# Patient Record
Sex: Female | Born: 1955
Health system: Southern US, Community
[De-identification: ages and names within clinical notes are randomized; demographics above are authoritative.]

## PROBLEM LIST (undated history)

## (undated) DIAGNOSIS — E890 Postprocedural hypothyroidism: Secondary | ICD-10-CM

## (undated) DIAGNOSIS — G4730 Sleep apnea, unspecified: Secondary | ICD-10-CM

## (undated) DIAGNOSIS — G4700 Insomnia, unspecified: Secondary | ICD-10-CM

## (undated) DIAGNOSIS — R42 Dizziness and giddiness: Secondary | ICD-10-CM

## (undated) DIAGNOSIS — C73 Malignant neoplasm of thyroid gland: Secondary | ICD-10-CM

## (undated) DIAGNOSIS — E209 Hypoparathyroidism, unspecified: Secondary | ICD-10-CM

## (undated) DIAGNOSIS — Z8719 Personal history of other diseases of the digestive system: Secondary | ICD-10-CM

## (undated) HISTORY — DX: Dizziness and giddiness: R42

## (undated) HISTORY — DX: Sleep apnea, unspecified: G47.30

## (undated) HISTORY — DX: Postprocedural hypothyroidism: E89.0

## (undated) HISTORY — DX: Malignant neoplasm of thyroid gland: C73

## (undated) HISTORY — DX: Personal history of other diseases of the digestive system: Z87.19

## (undated) HISTORY — DX: Insomnia, unspecified: G47.00

## (undated) HISTORY — DX: Hypoparathyroidism, unspecified: E20.9

---

## 2000-04-18 ENCOUNTER — Other Ambulatory Visit: Admission: RE | Admit: 2000-04-18 | Discharge: 2000-04-18 | Payer: Self-pay | Admitting: Obstetrics and Gynecology

## 2001-10-10 ENCOUNTER — Other Ambulatory Visit: Admission: RE | Admit: 2001-10-10 | Discharge: 2001-10-10 | Payer: Self-pay | Admitting: Obstetrics and Gynecology

## 2005-02-24 ENCOUNTER — Encounter (INDEPENDENT_AMBULATORY_CARE_PROVIDER_SITE_OTHER): Payer: Self-pay | Admitting: Family Medicine

## 2007-04-09 ENCOUNTER — Encounter (INDEPENDENT_AMBULATORY_CARE_PROVIDER_SITE_OTHER): Payer: Self-pay | Admitting: Family Medicine

## 2007-05-20 ENCOUNTER — Ambulatory Visit: Payer: Self-pay | Admitting: Family Medicine

## 2007-05-20 DIAGNOSIS — Z8719 Personal history of other diseases of the digestive system: Secondary | ICD-10-CM

## 2007-05-20 HISTORY — DX: Personal history of other diseases of the digestive system: Z87.19

## 2007-05-23 LAB — CONVERTED CEMR LAB
Basophils Absolute: 0 10*3/uL (ref 0.0–0.1)
Basophils Relative: 1 % (ref 0.0–1.0)
Direct LDL: 186.5 mg/dL
Eosinophils Relative: 1.9 % (ref 0.0–5.0)
HCT: 39.9 % (ref 36.0–46.0)
HDL: 57.2 mg/dL (ref >39.0)
Hemoglobin: 13.4 g/dL (ref 12.0–15.0)
MCHC: 33.5 g/dL (ref 30.0–36.0)
Monocytes Relative: 9.1 % (ref 3.0–11.0)
Neutrophils Relative %: 39.3 % — ABNORMAL LOW (ref 43.0–77.0)
RBC: 4.46 M/uL (ref 3.87–5.11)
RDW: 12.9 % (ref 11.5–14.6)
VLDL: 17 mg/dL (ref 0–40)

## 2007-05-24 ENCOUNTER — Telehealth (INDEPENDENT_AMBULATORY_CARE_PROVIDER_SITE_OTHER): Payer: Self-pay | Admitting: *Deleted

## 2007-05-29 ENCOUNTER — Encounter (INDEPENDENT_AMBULATORY_CARE_PROVIDER_SITE_OTHER): Payer: Self-pay | Admitting: *Deleted

## 2007-06-04 ENCOUNTER — Encounter: Admission: RE | Admit: 2007-06-04 | Discharge: 2007-06-04 | Payer: Self-pay | Admitting: Family Medicine

## 2007-06-06 ENCOUNTER — Telehealth (INDEPENDENT_AMBULATORY_CARE_PROVIDER_SITE_OTHER): Payer: Self-pay | Admitting: *Deleted

## 2007-06-17 LAB — CONVERTED CEMR LAB: Pap Smear: NORMAL

## 2007-06-26 ENCOUNTER — Telehealth (INDEPENDENT_AMBULATORY_CARE_PROVIDER_SITE_OTHER): Payer: Self-pay | Admitting: *Deleted

## 2007-06-28 ENCOUNTER — Telehealth (INDEPENDENT_AMBULATORY_CARE_PROVIDER_SITE_OTHER): Payer: Self-pay | Admitting: *Deleted

## 2007-07-03 ENCOUNTER — Encounter (INDEPENDENT_AMBULATORY_CARE_PROVIDER_SITE_OTHER): Payer: Self-pay | Admitting: Family Medicine

## 2007-07-15 ENCOUNTER — Encounter (INDEPENDENT_AMBULATORY_CARE_PROVIDER_SITE_OTHER): Payer: Self-pay | Admitting: Family Medicine

## 2007-07-23 ENCOUNTER — Encounter (INDEPENDENT_AMBULATORY_CARE_PROVIDER_SITE_OTHER): Payer: Self-pay | Admitting: Interventional Radiology

## 2007-07-23 ENCOUNTER — Encounter: Admission: RE | Admit: 2007-07-23 | Discharge: 2007-07-23 | Payer: Self-pay | Admitting: General Surgery

## 2007-07-23 ENCOUNTER — Other Ambulatory Visit: Admission: RE | Admit: 2007-07-23 | Discharge: 2007-07-23 | Payer: Self-pay | Admitting: Interventional Radiology

## 2007-08-02 ENCOUNTER — Encounter (INDEPENDENT_AMBULATORY_CARE_PROVIDER_SITE_OTHER): Payer: Self-pay | Admitting: Family Medicine

## 2007-09-16 ENCOUNTER — Ambulatory Visit (HOSPITAL_COMMUNITY): Admission: RE | Admit: 2007-09-16 | Discharge: 2007-09-17 | Payer: Self-pay | Admitting: General Surgery

## 2007-09-16 ENCOUNTER — Encounter (INDEPENDENT_AMBULATORY_CARE_PROVIDER_SITE_OTHER): Payer: Self-pay | Admitting: General Surgery

## 2007-09-20 ENCOUNTER — Ambulatory Visit: Payer: Self-pay | Admitting: Endocrinology

## 2007-09-20 DIAGNOSIS — C73 Malignant neoplasm of thyroid gland: Secondary | ICD-10-CM

## 2007-09-20 DIAGNOSIS — G4700 Insomnia, unspecified: Secondary | ICD-10-CM

## 2007-09-20 DIAGNOSIS — E890 Postprocedural hypothyroidism: Secondary | ICD-10-CM

## 2007-09-20 HISTORY — DX: Postprocedural hypothyroidism: E89.0

## 2007-09-20 HISTORY — DX: Malignant neoplasm of thyroid gland: C73

## 2007-09-20 HISTORY — DX: Insomnia, unspecified: G47.00

## 2007-09-21 ENCOUNTER — Telehealth (INDEPENDENT_AMBULATORY_CARE_PROVIDER_SITE_OTHER): Payer: Self-pay | Admitting: Family Medicine

## 2007-09-23 ENCOUNTER — Telehealth (INDEPENDENT_AMBULATORY_CARE_PROVIDER_SITE_OTHER): Payer: Self-pay | Admitting: *Deleted

## 2007-09-24 ENCOUNTER — Ambulatory Visit: Payer: Self-pay | Admitting: Endocrinology

## 2007-09-24 DIAGNOSIS — E209 Hypoparathyroidism, unspecified: Secondary | ICD-10-CM

## 2007-09-24 DIAGNOSIS — R252 Cramp and spasm: Secondary | ICD-10-CM | POA: Insufficient documentation

## 2007-09-24 HISTORY — DX: Hypoparathyroidism, unspecified: E20.9

## 2007-09-25 LAB — CONVERTED CEMR LAB
Basophils Absolute: 0 10*3/uL (ref 0.0–0.1)
CO2: 29 meq/L (ref 19–32)
Calcium: 7.6 mg/dL — ABNORMAL LOW (ref 8.4–10.5)
Chloride: 103 meq/L (ref 96–112)
Creatinine, Ser: 0.8 mg/dL (ref 0.4–1.2)
Eosinophils Absolute: 0.1 10*3/uL (ref 0.0–0.6)
Eosinophils Relative: 0.8 % (ref 0.0–5.0)
GFR calc Af Amer: 97 mL/min
GFR calc non Af Amer: 80 mL/min
Hemoglobin: 13.4 g/dL (ref 12.0–15.0)
MCHC: 33.2 g/dL (ref 30.0–36.0)
Neutro Abs: 4 10*3/uL (ref 1.4–7.7)
Neutrophils Relative %: 62.9 % (ref 43.0–77.0)
Potassium: 3.5 meq/L (ref 3.5–5.1)
Sodium: 142 meq/L (ref 135–145)

## 2007-10-01 ENCOUNTER — Ambulatory Visit: Payer: Self-pay | Admitting: Endocrinology

## 2007-10-01 LAB — CONVERTED CEMR LAB
BUN: 13 mg/dL (ref 6–23)
Chloride: 103 meq/L (ref 96–112)
Creatinine, Ser: 0.8 mg/dL (ref 0.4–1.2)
Potassium: 3.7 meq/L (ref 3.5–5.1)

## 2007-10-09 ENCOUNTER — Encounter: Payer: Self-pay | Admitting: Endocrinology

## 2007-10-29 ENCOUNTER — Encounter (INDEPENDENT_AMBULATORY_CARE_PROVIDER_SITE_OTHER): Payer: Self-pay | Admitting: Family Medicine

## 2007-11-01 ENCOUNTER — Ambulatory Visit: Payer: Self-pay | Admitting: Endocrinology

## 2007-11-01 DIAGNOSIS — S20219A Contusion of unspecified front wall of thorax, initial encounter: Secondary | ICD-10-CM | POA: Insufficient documentation

## 2007-11-04 ENCOUNTER — Encounter (INDEPENDENT_AMBULATORY_CARE_PROVIDER_SITE_OTHER): Payer: Self-pay | Admitting: *Deleted

## 2007-11-04 LAB — CONVERTED CEMR LAB
Basophils Absolute: 0 10*3/uL (ref 0.0–0.1)
Eosinophils Absolute: 0.1 10*3/uL (ref 0.0–0.6)
Hemoglobin: 12.5 g/dL (ref 12.0–15.0)
Lymphocytes Relative: 44.5 % (ref 12.0–46.0)
MCV: 90 fL (ref 78.0–100.0)
Monocytes Absolute: 0.5 10*3/uL (ref 0.2–0.7)
Monocytes Relative: 9.1 % (ref 3.0–11.0)
Neutrophils Relative %: 44.6 % (ref 43.0–77.0)

## 2007-11-05 LAB — CONVERTED CEMR LAB
Calcium, Total (PTH): 9.1 mg/dL (ref 8.4–10.5)
PTH: 12.2 pg/mL — ABNORMAL LOW (ref 14.0–72.0)

## 2007-12-30 ENCOUNTER — Telehealth (INDEPENDENT_AMBULATORY_CARE_PROVIDER_SITE_OTHER): Payer: Self-pay | Admitting: *Deleted

## 2007-12-31 ENCOUNTER — Ambulatory Visit: Payer: Self-pay | Admitting: Endocrinology

## 2008-01-01 LAB — CONVERTED CEMR LAB: TSH: 0.79 microintl units/mL (ref 0.35–5.50)

## 2008-02-11 ENCOUNTER — Telehealth (INDEPENDENT_AMBULATORY_CARE_PROVIDER_SITE_OTHER): Payer: Self-pay | Admitting: *Deleted

## 2008-03-30 ENCOUNTER — Ambulatory Visit: Payer: Self-pay | Admitting: Endocrinology

## 2008-03-30 LAB — CONVERTED CEMR LAB
PTH: 18.2 pg/mL (ref 14.0–72.0)
TSH: 1.37 microintl units/mL (ref 0.35–5.50)

## 2008-04-06 ENCOUNTER — Ambulatory Visit: Payer: Self-pay | Admitting: Endocrinology

## 2008-04-06 LAB — CONVERTED CEMR LAB: TSH: 18.45 microintl units/mL — ABNORMAL HIGH (ref 0.35–5.50)

## 2008-04-09 ENCOUNTER — Telehealth: Payer: Self-pay | Admitting: Endocrinology

## 2008-04-10 ENCOUNTER — Ambulatory Visit: Payer: Self-pay | Admitting: Endocrinology

## 2008-04-13 ENCOUNTER — Telehealth: Payer: Self-pay | Admitting: Endocrinology

## 2008-04-15 ENCOUNTER — Encounter: Admission: RE | Admit: 2008-04-15 | Discharge: 2008-04-15 | Payer: Self-pay | Admitting: Endocrinology

## 2008-04-22 ENCOUNTER — Encounter: Admission: RE | Admit: 2008-04-22 | Discharge: 2008-04-22 | Payer: Self-pay | Admitting: Endocrinology

## 2008-06-05 ENCOUNTER — Ambulatory Visit: Payer: Self-pay | Admitting: Endocrinology

## 2008-06-05 LAB — CONVERTED CEMR LAB: Calcium, Total (PTH): 8.4 mg/dL (ref 8.4–10.5)

## 2008-07-17 ENCOUNTER — Ambulatory Visit: Payer: Self-pay | Admitting: Endocrinology

## 2008-07-17 ENCOUNTER — Telehealth (INDEPENDENT_AMBULATORY_CARE_PROVIDER_SITE_OTHER): Payer: Self-pay | Admitting: *Deleted

## 2008-07-17 DIAGNOSIS — R42 Dizziness and giddiness: Secondary | ICD-10-CM

## 2008-07-17 HISTORY — DX: Dizziness and giddiness: R42

## 2008-07-20 ENCOUNTER — Telehealth: Payer: Self-pay | Admitting: Endocrinology

## 2008-07-20 LAB — CONVERTED CEMR LAB
Eosinophils Relative: 2 % (ref 0.0–5.0)
Lymphocytes Relative: 35.7 % (ref 12.0–46.0)
MCHC: 33.5 g/dL (ref 30.0–36.0)
MCV: 89.7 fL (ref 78.0–100.0)
Monocytes Relative: 10.6 % (ref 3.0–12.0)
Neutrophils Relative %: 51.5 % (ref 43.0–77.0)
Platelets: 223 10*3/uL (ref 150–400)
RBC: 4.49 M/uL (ref 3.87–5.11)
TSH: 0.11 microintl units/mL — ABNORMAL LOW (ref 0.35–5.50)

## 2008-09-04 ENCOUNTER — Telehealth (INDEPENDENT_AMBULATORY_CARE_PROVIDER_SITE_OTHER): Payer: Self-pay | Admitting: *Deleted

## 2008-09-08 ENCOUNTER — Ambulatory Visit: Payer: Self-pay | Admitting: Endocrinology

## 2008-09-08 LAB — CONVERTED CEMR LAB
Calcium, Total (PTH): 9.2 mg/dL
PTH: 23.5 pg/mL

## 2008-09-25 LAB — HM COLONOSCOPY: HM Colonoscopy: NORMAL

## 2008-09-30 ENCOUNTER — Telehealth: Payer: Self-pay | Admitting: Endocrinology

## 2008-10-05 ENCOUNTER — Encounter (HOSPITAL_COMMUNITY): Admission: RE | Admit: 2008-10-05 | Discharge: 2009-01-03 | Payer: Self-pay | Admitting: Endocrinology

## 2008-11-20 IMAGING — CR DG CHEST 2V
2 series · 2 of 2 positions shown · non-contrast
Comparison: none

CLINICAL DATA: Preoperative respiratory film on patient with papillary carcinoma of the thyroid. 
 CHEST - 2 VIEW:

[view not recorded (1 of 2)]
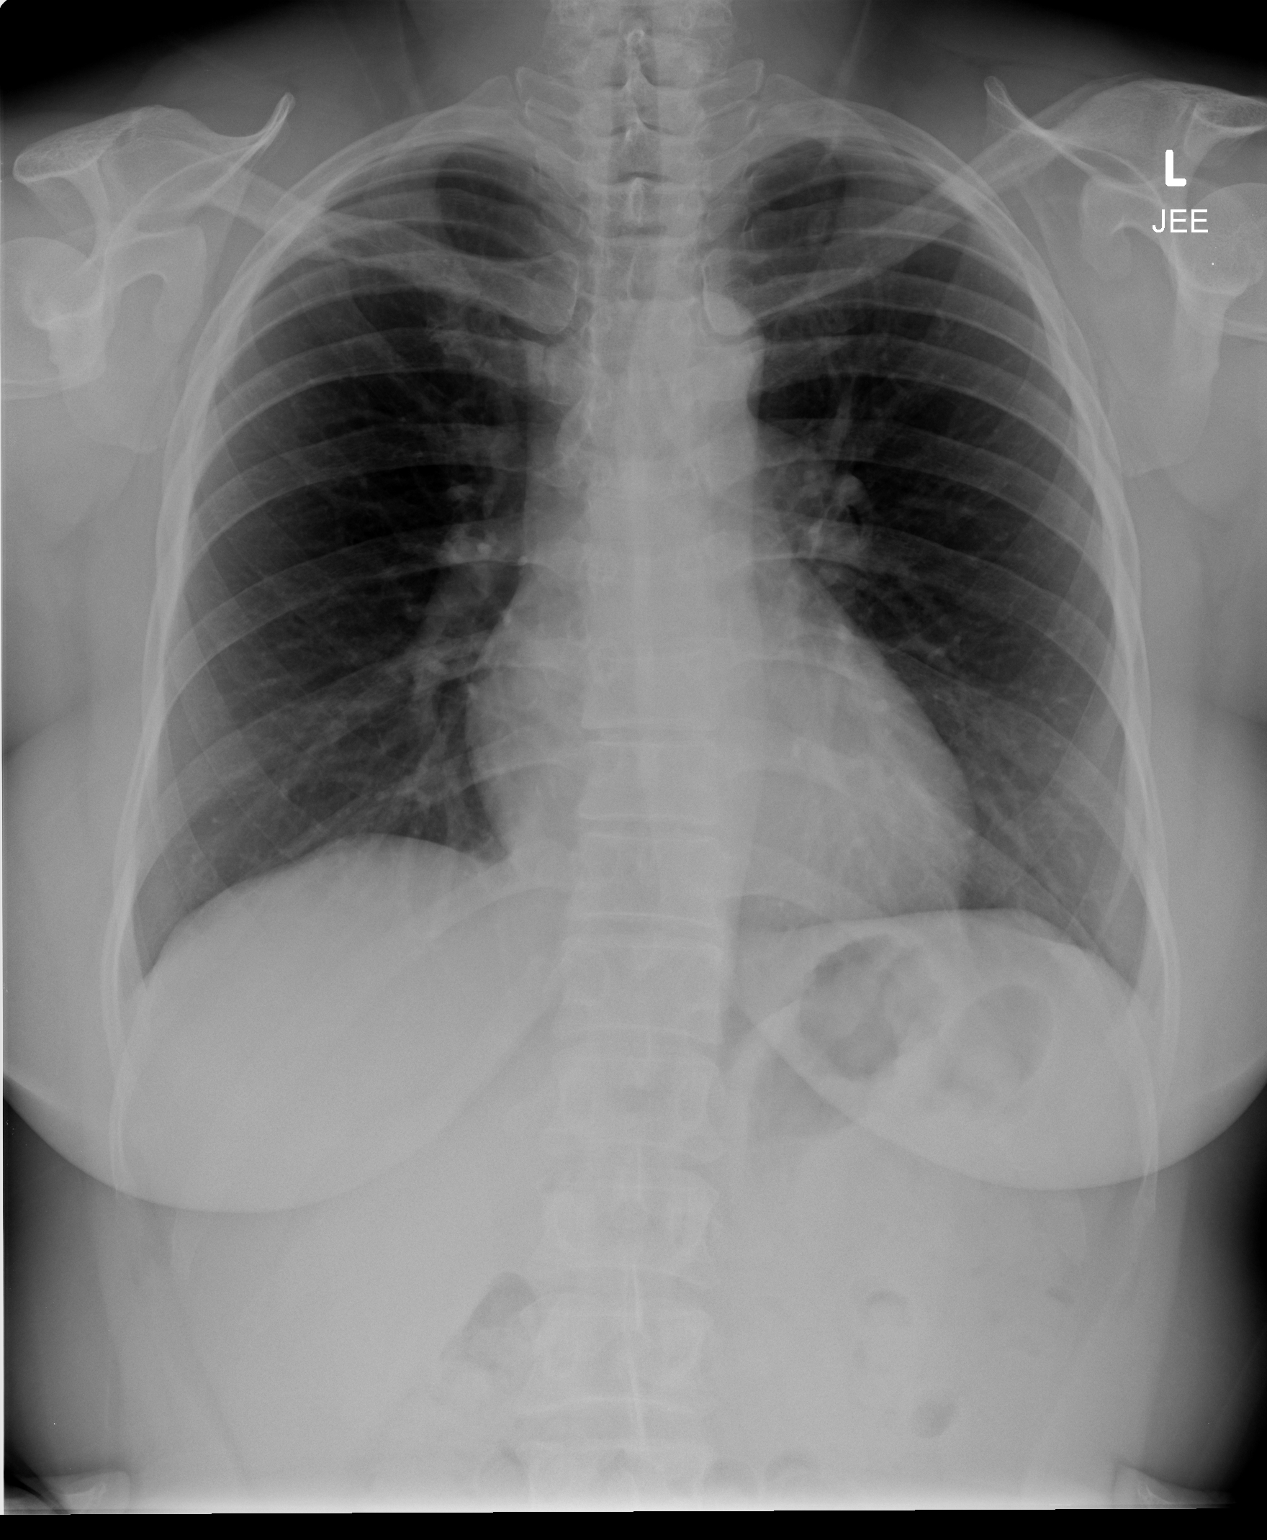

[view not recorded (2 of 2)]
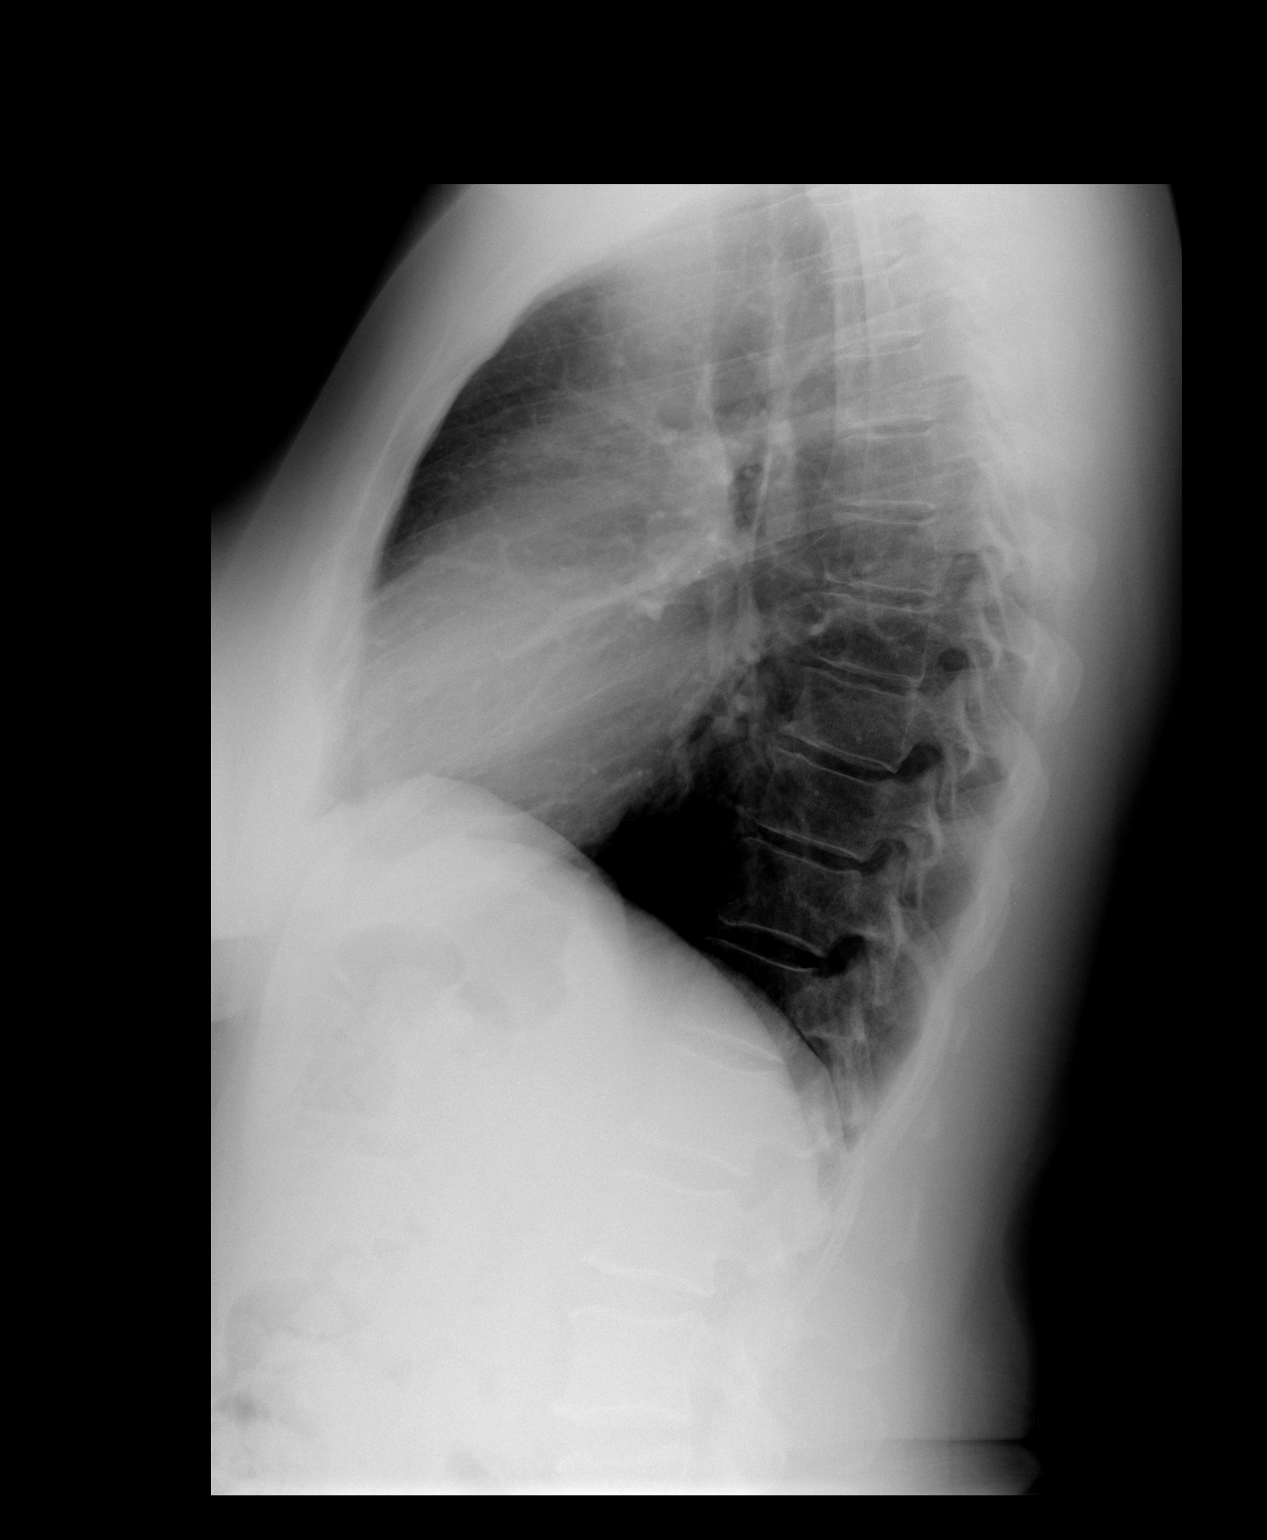

[2 of 2 positions shown; findings below may reference images not displayed]

FINDINGS: Lungs are clear. Heart size is normal.  No effusion.  No focal bony abnormality.
IMPRESSION: Negative chest.

## 2009-03-23 ENCOUNTER — Ambulatory Visit: Payer: Self-pay | Admitting: Endocrinology

## 2009-03-23 LAB — CONVERTED CEMR LAB
PTH: 23.8 pg/mL (ref 14.0–72.0)
TSH: 0.12 microintl units/mL — ABNORMAL LOW (ref 0.35–5.50)

## 2009-03-26 ENCOUNTER — Ambulatory Visit (HOSPITAL_COMMUNITY): Admission: RE | Admit: 2009-03-26 | Discharge: 2009-03-26 | Payer: Self-pay | Admitting: Obstetrics and Gynecology

## 2009-03-26 ENCOUNTER — Encounter (INDEPENDENT_AMBULATORY_CARE_PROVIDER_SITE_OTHER): Payer: Self-pay | Admitting: Obstetrics and Gynecology

## 2009-03-30 ENCOUNTER — Telehealth (INDEPENDENT_AMBULATORY_CARE_PROVIDER_SITE_OTHER): Payer: Self-pay | Admitting: *Deleted

## 2009-03-30 ENCOUNTER — Telehealth: Payer: Self-pay | Admitting: Endocrinology

## 2009-08-16 ENCOUNTER — Telehealth (INDEPENDENT_AMBULATORY_CARE_PROVIDER_SITE_OTHER): Payer: Self-pay | Admitting: *Deleted

## 2009-08-31 ENCOUNTER — Ambulatory Visit: Payer: Self-pay | Admitting: Endocrinology

## 2009-08-31 LAB — CONVERTED CEMR LAB
Calcium, Total (PTH): 8.4 mg/dL (ref 8.4–10.5)
PTH: 21.5 pg/mL (ref 14.0–72.0)
TSH: 0.17 microintl units/mL — ABNORMAL LOW (ref 0.35–5.50)

## 2009-10-21 ENCOUNTER — Encounter: Payer: Self-pay | Admitting: Endocrinology

## 2009-10-27 ENCOUNTER — Telehealth: Payer: Self-pay | Admitting: Endocrinology

## 2010-02-23 ENCOUNTER — Encounter: Payer: Self-pay | Admitting: Endocrinology

## 2010-03-09 ENCOUNTER — Encounter: Admission: RE | Admit: 2010-03-09 | Discharge: 2010-03-09 | Payer: Self-pay | Admitting: Internal Medicine

## 2010-04-11 ENCOUNTER — Encounter: Payer: Self-pay | Admitting: Endocrinology

## 2010-10-16 ENCOUNTER — Encounter: Payer: Self-pay | Admitting: Endocrinology

## 2010-10-18 ENCOUNTER — Ambulatory Visit: Admit: 2010-10-18 | Payer: Self-pay | Admitting: Endocrinology

## 2010-10-25 NOTE — Progress Notes (Signed)
Summary: Low TSH  Phone Note Call from Patient   Caller: Patient [phone] Summary of Call: pt called requesting MD review lab report sent over from PCP with low TSH and make necessary adjustments. Initial call taken by: Margaret Pyle, CMA,  October 27, 2009 10:19 AM  Follow-up for Phone Call        only a slight adjustment is needed.  reduce synthroid to 137 micrograms/day.  i sent rx. Follow-up by: Minus Breeding MD,  October 27, 2009 12:35 PM  Additional Follow-up for Phone Call Additional follow up Details #1::        left message on machine for pt to return my call  Additional Follow-up by: Margaret Pyle, CMA,  October 27, 2009 2:25 PM    Additional Follow-up for Phone Call Additional follow up Details #2::    pt informed Follow-up by: Margaret Pyle, CMA,  October 28, 2009 8:04 AM  New/Updated Medications: SYNTHROID 137 MCG TABS (LEVOTHYROXINE SODIUM) 1 qd Prescriptions: SYNTHROID 137 MCG TABS (LEVOTHYROXINE SODIUM) 1 qd  #30 x 11   Entered and Authorized by:   Minus Breeding MD   Signed by:   Minus Breeding MD on 10/27/2009   Method used:   Electronically to        [facility]* (retail)       [address]       [place], [place]  [address]       Ph: [phone]       Fax: [phone]   RxID:   [id number]

## 2010-10-25 NOTE — Letter (Signed)
Summary: Primary Care Appointment Letter  [facility]  [address] [place], [place] [address]   Phone: [phone]  Fax: [phone]    04/11/2010 MRN: [id number]  [facility] [address] [place], [place]  [address]  Dear Ms. Peggy Wagner,   Your Primary Care Physician Minus Breeding MD has indicated that:    ___X____it is time to schedule an appointment in December with Dr. Everardo All.  Please call the office to set up an appointment.    _______you missed your appointment on______ and need to call and          reschedule.    _______you need to have lab work done.    _______you need to schedule an appointment discuss lab or test results.    _______you need to call to reschedule your appointment that is                       scheduled on _________.     Please call our office as soon as possible. Our phone number is [phone]. Please press option 1. Our office is open 8a-12noon and 1p-5p, Monday through Friday.     Thank you,    [facility]

## 2010-10-28 ENCOUNTER — Other Ambulatory Visit: Payer: BC Managed Care – PPO

## 2010-10-28 ENCOUNTER — Encounter: Payer: Self-pay | Admitting: Endocrinology

## 2010-10-28 ENCOUNTER — Ambulatory Visit (INDEPENDENT_AMBULATORY_CARE_PROVIDER_SITE_OTHER): Payer: BC Managed Care – PPO | Admitting: Endocrinology

## 2010-10-28 ENCOUNTER — Other Ambulatory Visit: Payer: Self-pay | Admitting: Endocrinology

## 2010-10-28 ENCOUNTER — Encounter (INDEPENDENT_AMBULATORY_CARE_PROVIDER_SITE_OTHER): Payer: Self-pay | Admitting: *Deleted

## 2010-10-28 DIAGNOSIS — C73 Malignant neoplasm of thyroid gland: Secondary | ICD-10-CM

## 2010-10-28 DIAGNOSIS — E890 Postprocedural hypothyroidism: Secondary | ICD-10-CM

## 2010-10-28 LAB — CONVERTED CEMR LAB: Thyroglobulin Ab: <20 (ref <40.0)

## 2010-11-10 NOTE — Assessment & Plan Note (Signed)
Summary: RS FROM JAN-MD NOT IN CLINIC--D/T PER PT--STC   Vital Signs:  Patient profile:   55 year old female Height:      64.25 inches (163.19 cm) Weight:      207.25 pounds (94.20 kg) BMI:     35.43 O2 Sat:      97 % on Room air Temp:     98.5 degrees F (36.94 degrees C) oral Pulse rate:   95 / minute BP sitting:   118 / 80  (left arm) Cuff size:   large  Vitals Entered By: Brenton Grills CMA Duncan Dull) (October 28, 2010 3:33 PM)  O2 Flow:  Room air CC: Follow up on thyroid/aj Is Patient Diabetic? No   Primary Provider:  Dr Nehemiah Settle  CC:  Follow up on thyroid/aj.  History of Present Illness: the status of at least 3 ongoing medical problems is addressed today: stage-1 papillary adenocarcinoma of the thyroid: she does not notice any nodule at the neck.   hypoparathyroidism: she has slight tingling in her fingers and feet.   postsurgical hypothyroidism:  denies weight loss.  Current Medications (verified): 1)  Crestor 10 Mg Tabs (Rosuvastatin Calcium) .Marland Kitchen.. 1 Tab Once A Day 2)  Centrum Silver Ultra Womens  Tabs (Multiple Vitamins-Minerals) .Marland Kitchen.. 1 Tab Once A Day 3)  Calcium With Vitamin D 4)  Synthroid 137 Mcg Tabs (Levothyroxine Sodium) .Marland Kitchen.. 1 Qd  Allergies (verified): 1)  ! Pcn 2)  ! Erythromycin 3)  ! Codeine  Past History:  Past Medical History: CARCINOMA, THYROID GLAND, PAPILLARY, stage 1 (ICD-193) 12/08   thyroidectomy--1.8 cm right papillary adeno ca 7/09  i-131 109 mci 12/09  tg neg (ab neg) 1/10  thyrogen scan neg 6/10  tg neg (ab neg) 2/12: tg neg (ab neg) INSOMNIA (ICD-780.52) HYPOTHYROIDISM, POSTSURGICAL (ICD-244.0) RECTAL BLEEDING, HX OF (ICD-V12.79) WELL ADULT EXAM (ICD-V70.0) FAMILY HISTORY OSTEOPOROSIS (ICD-V17.8)  Review of Systems  The patient denies weight gain.         no neck pain  Physical Exam  General:  obese.  no distress  Neck:  a healed scar is present.  i do not appreciate a nodule in the thyroid or elsewhere in the  neck  Additional Exam:  FastTSH              [L]  0.10 uIU/mL      hyroglobulin Antibody    <20.0 U/mL                  <40.0 Thyroglobulin             <0.2 ng/mL                  0.0-55.0   Parathyroid Hormone       18.2 pg/mL                  14.0-72.0   Calcium              [L]  8.1 mg/dL      Impression & Recommendations:  Problem # 1:  CARCINOMA, THYROID GLAND, PAPILLARY (ICD-193) no evidence of recurrence  Problem # 2:  HYPOPARATHYROIDISM (ICD-252.1) Assessment: Deteriorated  Problem # 3:  HYPOTHYROIDISM, POSTSURGICAL (ICD-244.0) suppressed tsh is appropriate in view of #1  Medications Added to Medication List This Visit: 1)  Calcitriol 0.25 Mcg Caps (Calcitriol) .Marland Kitchen.. 1 tab once daily  Other Orders: T-Parathyroid Hormone, Intact w/ Calcium ([address]) T-Thyroglobulin Panel ([address], [address]78-29562) TLB-TSH (Thyroid Stimulating Hormone) (84443-TSH) Est. Patient  Level IV ([address])  Patient Instructions: 1)  blood tests are being ordered for you today.  please call [phone] to hear your test results. 2)  Please schedule a follow-up appointment in 1 year. 3)  (update: i left message on phone-tree:  resume rocaltrol 0.25 micrograms/d.  ret 6 mos). Prescriptions: CALCITRIOL 0.25 MCG CAPS (CALCITRIOL) 1 tab once daily  #30 x 6   Entered and Authorized by:   Minus Breeding MD   Signed by:   Minus Breeding MD on 10/31/2010   Method used:   Electronically to        [facility]* (retail)       [address]       [place], [place]  [id number]       Ph: [phone]       Fax: [phone]   RxID:   [id number] SYNTHROID 137 MCG TABS (LEVOTHYROXINE SODIUM) 1 qd  #90 x 3   Entered and Authorized by:   Minus Breeding MD   Signed by:   Minus Breeding MD on 10/28/2010   Method used:   Electronically to        [facility]* (retail)       [address]       [place], [place]  [id number]       Ph: [phone]       Fax: [phone]   RxID:    [id number]    Orders Added: 1)  T-Parathyroid Hormone, Intact w/ Calcium [02542-70623] 2)  T-Thyroglobulin Panel [76283, [address]] 3)  TLB-TSH (Thyroid Stimulating Hormone) [84443-TSH] 4)  Est. Patient Level IV [[address]]   Not Administered:    Influenza Vaccine # 1 not given due to: declined      Preventive Care Screening  Colonoscopy:    Date:  09/25/2008    Next Due:  09/2018    Results:  normal   Mammogram:    Date:  02/23/2010    Results:  normal   Pap Smear:    Date:  02/23/2010    Results:  normal

## 2011-01-01 LAB — BASIC METABOLIC PANEL
Calcium: 7.9 mg/dL — ABNORMAL LOW (ref 8.4–10.5)
Creatinine, Ser: 0.73 mg/dL (ref 0.4–1.2)
Glucose, Bld: 92 mg/dL (ref 70–99)
Sodium: 138 mEq/L (ref 135–145)

## 2011-01-01 LAB — CBC
HCT: 38.9 % (ref 36.0–46.0)
Hemoglobin: 13.3 g/dL (ref 12.0–15.0)
RDW: 13.1 % (ref 11.5–15.5)

## 2011-01-01 LAB — URINALYSIS, ROUTINE W REFLEX MICROSCOPIC
Bilirubin Urine: NEGATIVE
Glucose, UA: NEGATIVE mg/dL
Hgb urine dipstick: NEGATIVE
Specific Gravity, Urine: <1.005 — ABNORMAL LOW (ref 1.005–1.030)
pH: 7.5 (ref 5.0–8.0)

## 2011-02-07 NOTE — Op Note (Signed)
Peggy Wagner, Peggy Wagner NO.:  [id number]   MEDICAL RECORD NO.:  [id number]          PATIENT TYPE:  OIB   LOCATION:  5151                         FACILITY:  [facility]   PHYSICIAN:  Lennie Muckle, MD      DATE OF BIRTH:  Mar 28, 1956   DATE OF PROCEDURE:  DATE OF DISCHARGE:                               OPERATIVE REPORT   PREOPERATIVE DIAGNOSIS:  Papillary cancer of the right thyroid lobe.   POSTOPERATIVE DIAGNOSIS:  Papillary cancer of the right thyroid lobe.   PROCEDURE:  Total thyroidectomy.   ANESTHESIA:  General endotracheal anesthesia.   SURGEON:  Lennie Muckle, MD   ASSISTANT:  Currie Paris, M.D.   A minimum amount of blood loss.  No immediate complications.  No drains  were placed.   INDICATIONS FOR PROCEDURE:  Peggy Wagner is a 55 year old female who began  to notice neck swelling over the past year.  Ultrasound revealed a  nodule on her right lobe.  FNA was consistent with a papillary  carcinoma.  The size was 2.9 cm.  Due to this size factor, I elected to  perform a total thyroidectomy.  Informed consent was obtained prior to  the procedure.  It was discussed with Peggy Wagner the possibility of blood  loss, however, she is a TEFL teacher Witness, and this was taken into  consideration for the case.   DETAILS OF PROCEDURE:  Peggy Wagner was identified in the preoperative  holding suite.  She was then taken to the operating room where she was  placed in a supine position.  After administration of general  endotracheal anesthesia, she was placed in a chair position with  elevation of the head.  The anterior neck was prepped and draped in the  usual sterile fashion.  A time out was performed indentifying the  patient and procedure.  The sternal notch was palpated as well as the  cricothyroid membrane.  Approximately two fingerbreadths above the  sternal notch, I placed an incision 6 cm in size across the midline of  the neck.  The thyroid was easily palpated  on the right.  Subcutaneous  tissues were divided with electrocautery.  The platysma was easily  separated, superior and inferior flaps elevated, and the platysmal  muscle was successfully performed to the sternal notch as well as  superiorly to the cricothyroid membrane.  The strap muscles were then  divided in the midline with electrocautery and dissection first  proceeded laterally to the right thyroid lobe.  I was able to easily  palpate the right lobe, and it easily with finger dissection elevated  from the surrounding tissues.  I was able to identify the superior lobe  of the superior pole of the thyroid gland, and identified a piece of  tissue which was believed to be a parathyroid gland superiorly.  This  was carefully dissected from the parathyroid sparing the parathyroid  gland and then proceeded dissection distally to the middle of the  thyroid and inferior thyroid vessels.  The recurrent laryngeal nerve was  identified and care was noted to dissect the  thyroid at [facility] ligament  to avoid injury to the recurrent laryngeal nerve.  Using the harmonic  scalpel, I dissected the thyroid from Barry's ligament and proceeded  medially to the isthmus of the thyroid gland.  The inferior parathyroid  was also visualized and spared during the dissection of the right lobe.  I then proceeded with the dissection of the left lobe of the thyroid  gland.  Superiorly, superior pole was identified as was the superior  parathyroid gland.  This was preserved.  Superior pole vessels were  clipped and divided with the harmonic scalpel.  Recurrent laryngeal  nerve was also clearly identified on the left, and using the careful  dissection with the harmonic scalpel, I was able to dissect the thyroid  from Barry's ligament on the left side as well.  Inferior pole vessels  were also divided with the clips and the harmonic scalpel.  The thyroid  gland was then removed.  The right pole was identified with  the suture.  The wound bed was then irrigated.  A minimal amount of oozing was  controlled with electrocautery as well as a silk suture placed and a  small venous tributary inferiorly.  Surgicel was placed bilaterally on  either bed dissection for further control hemostasis and bed dissection  for further control hemostasis.  No bleeding was evident at the end of  the case.  The strap muscles were reapproximated with a running 3-0  Vicryl suture.  Platysma muscles were reapproximated with interrupted  3-  0 Vicryl.  Skin was closed with 4-0 Monocryl.  Dermabond was placed for  a final dressing.  The patient was then extubated and transported to the  postanesthesia care unit in stable condition.      Lennie Muckle, MD  Electronically Signed     ALA/MEDQ  D:  09/16/2007  T:  09/16/2007  Job:  [id number]

## 2011-02-07 NOTE — Op Note (Signed)
Peggy Wagner, Peggy Wagner NO.:  [id number]   MEDICAL RECORD NO.:  [id number]          PATIENT TYPE:  AMB   LOCATION:  SDC                           [facility]:  [facility]   PHYSICIAN:  Janine Limbo, M.D.DATE OF BIRTH:  Feb 03, 1956   DATE OF PROCEDURE:  03/26/2009  DATE OF DISCHARGE:                               OPERATIVE REPORT   PREOPERATIVE DIAGNOSES:  1. Irregular uterine bleeding.  2. Endometrial polyps.   POSTOPERATIVE DIAGNOSES:  1. Irregular uterine bleeding.  2. Endometrial polyps.   PROCEDURE:  1. Hysteroscopy with resection of endometrial polyps.  2. Dilatation and curettage.   SURGEON:  Janine Limbo, MD   FIRST ASSISTANT:  None.   ANESTHETIC:  General.   DISPOSITION:  [name] is a 55 year old female, para 0-0-1-0, who  presents with the above-mentioned diagnoses.  She understands the  indications for her surgical procedure and she accepts the risks of, but  not limited to, anesthetic complications, bleeding, infections, and  possible damage to the surrounding organs.  She understands the other  treatment options.   FINDINGS:  The uterus was normal size, shape, and consistency.  No  adnexal masses were appreciated.  On hysteroscopy, the patient was noted  to have 3 endometrial polyps.  No other pathology was appreciated.  The  estimated fluid deficit was 45 mL.   PROCEDURE:  The patient was taken to the operating room, where a general  anesthetic was given.  The patient's abdomen, perineum, and vagina were  prepped with multiple layers of Betadine.  The bladder was drained of  urine.  The patient was sterilely draped.  Examination under anesthesia  was performed.  A paracervical block was placed using a 10 mL of 0.5%  Marcaine with epinephrine.  An endocervical curettage was obtained.  The  uterus sounded to 8 cm.  The cervix was gently dilated.  The operative  hysteroscope was inserted and the cavity was carefully inspected.  Pictures  were taken.  We used a single loop to resect the endometrial  polyps.  We then removed the hysteroscope and the cavity was curetted  using a medium sharp curette.  The cavity was felt to be clean at the  end of our procedure.  The hysteroscopy was repeated and no other  pathology was appreciated.  The patient was given Toradol 30 mg  intramuscularly and 30 mg intravenously.  Hemostasis was noted to be  adequate.  She was awakened from her anesthetic without difficulty.  Sponge, needle, and instrument counts were correct on 2 occasions.  The  estimated fluid deficit was 45 mL.  The estimated blood loss was 10 mL.  The patient was transported to the recovery room after her anesthetic  was reversed.  She was in stable condition.  The endocervical  curettings, endometrial resections, and endometrial curettings were sent  to pathology for evaluation.   FOLLOWUP INSTRUCTIONS:  The patient will return to see Dr. Stefano Gaul in 2  weeks for followup examination.  She was given a copy of the  postoperative instruction sheet as prepared by the [facility]  [place] for patients, who have undergone a dilatation and curettage.  She was given a prescription for:  1. Zofran 8 mg every 8 hours as needed for nausea.  2. Motrin 800 mg every 8 hours as needed for mild-to-moderate pain.  3. Darvocet 1 or 2 tablets every 4 hours as needed for severe pain.      The patient will call for questions or concerns.      Janine Limbo, M.D.  Electronically Signed     AVS/MEDQ  D:  03/26/2009  T:  03/27/2009  Job:  [id number]

## 2011-02-07 NOTE — H&P (Signed)
NAMEERIKAH, THUMM NO.:  [id number]   MEDICAL RECORD NO.:  [id number]         PATIENT TYPE:  WAMB   LOCATION:                                [facility]  [facility]   PHYSICIAN:  Janine Limbo, M.D.DATE OF BIRTH:  02/11/1956   DATE OF ADMISSION:  03/26/2009  DATE OF DISCHARGE:                              HISTORY & PHYSICAL   HISTORY OF PRESENT ILLNESS:  Ms. Wagner is a 55 year old female, gravida  1, para 0-0-1-0, who presents for a hysteroscopy with resection and  dilatation and curettage.  The patient has been followed at the [facility]  [facility].  The patient had irregular bleeding.  A hydrosonogram was  performed which showed a 6.24 x 4.49 cm uterus with 2 endometrial masses  measuring 1.37 cm and 0.52 cm in size.  An endometrial biopsy was  performed which showed benign elements.  The patient's most recent Pap  smear showed atypical squamous cells of undetermined significance.  Her  HPV test was negative, however.   PAST MEDICAL HISTORY:  The patient has a history of elevated cholesterol  and a history of hypothyroidism.  She was currently taking Crestor and  Synthroid.   OBSTETRICAL HISTORY:  The patient had 1 elective pregnancy termination.   DRUG ALLERGIES:  The patient is allergic to CODEINE, PENICILLIN, and  AZITHROMYCIN.   SOCIAL HISTORY:  The patient drinks alcohol socially.  She denies  cigarette use and other recreational drug uses.   REVIEW OF SYSTEMS:  The patient has occasional insomnia.   FAMILY HISTORY:  Noncontributory.   PHYSICAL EXAMINATION:  VITAL SIGNS:  Weight is 200 pounds.  Height is 5  feet 4 inches.  HEENT:  Within normal limits.  CHEST:  Clear.  HEART:  Regular rate and rhythm.  BREASTS:  Without masses.  ABDOMEN:  Nontender.  EXTREMITIES:  Grossly normal.  NEUROLOGIC:  Grossly normal.  PELVIC:  External genitalia is normal.  Vagina is normal.  Cervix is  nontender.  Uterus is upper limits normal size.  Adnexa, no masses and  rectovaginal exam confirms.   ASSESSMENT:  1. Irregular bleeding.  2. Endometrial polyps.  3. Obesity.  4. Hypothyroidism.  5. Elevated cholesterol.   PLAN:  The patient will undergo a hysteroscopy with resection of  endometrial polyps.  She will also undergo a dilatation and curettage.  She understands the indications for her surgical procedure and she  accepts the risks of, but not limited to, anesthetic complications,  bleeding, infections, and possible damage to the surrounding organs.      Janine Limbo, M.D.  Electronically Signed     AVS/MEDQ  D:  03/22/2009  T:  03/23/2009  Job:  [id number]

## 2011-04-25 ENCOUNTER — Other Ambulatory Visit (INDEPENDENT_AMBULATORY_CARE_PROVIDER_SITE_OTHER): Payer: BC Managed Care – PPO

## 2011-04-25 ENCOUNTER — Ambulatory Visit (INDEPENDENT_AMBULATORY_CARE_PROVIDER_SITE_OTHER): Payer: BC Managed Care – PPO | Admitting: Endocrinology

## 2011-04-25 ENCOUNTER — Encounter: Payer: Self-pay | Admitting: Endocrinology

## 2011-04-25 DIAGNOSIS — C73 Malignant neoplasm of thyroid gland: Secondary | ICD-10-CM

## 2011-04-25 DIAGNOSIS — E209 Hypoparathyroidism, unspecified: Secondary | ICD-10-CM

## 2011-04-25 DIAGNOSIS — E890 Postprocedural hypothyroidism: Secondary | ICD-10-CM

## 2011-04-25 LAB — TSH: TSH: 0.14 u[IU]/mL — ABNORMAL LOW (ref 0.35–5.50)

## 2011-04-25 NOTE — Progress Notes (Signed)
  Subjective:    Patient ID: Peggy Wagner, female    DOB: March 04, 1956, 55 y.o.   MRN: [id number]  HPI Pt says she feels no different, since she is back on the rocaltrol.   She does not notice any nodule at the neck She has 4 years of stage-1 papillary adenocarcinoma of the thyroid, confined to the neck, which is considered mild. She has assoc postsurgical hypoparathyroidism. 12/08   thyroidectomy--1.8 cm right papillary adeno ca. 7/09  i-131 109 mci 12/09  tg neg (ab neg) 1/10  thyrogen scan neg 6/10  tg neg (ab neg) 2/12: tg neg (ab neg) Past Medical History  Diagnosis Date  . CARCINOMA, THYROID GLAND, PAPILLARY 09/20/2007    12/08-thyroidectomy--1.8 cm right papillary adeno ca 07/09-I 131 109 mci  12/09 -tg neg (ab neg) 1/10-thyrogen scan neg 06/10-tg neg (ab neg) 02/12-tg neg (ab neg)  . HYPOTHYROIDISM, POSTSURGICAL 09/20/2007  . Hypoparathyroidism 09/24/2007  . Dizziness and giddiness 07/17/2008  . INSOMNIA 09/20/2007  . RECTAL BLEEDING, HX OF 05/20/2007  . Sleep apnea     No past surgical history on file.  History   Social History  . Marital Status: Single    Spouse Name: N/A    Number of Children: N/A  . Years of Education: N/A   Occupational History  . Teacher Sales executive     [employer]   Social History Main Topics  . Smoking status: Never Smoker   . Smokeless tobacco: Not on file  . Alcohol Use: Yes     occasional  . Drug Use: No  . Sexually Active:    Other Topics Concern  . Not on file   Social History Narrative   Regular exercise-no    Current Outpatient Prescriptions on File Prior to Visit  Medication Sig Dispense Refill  . calcitRIOL (ROCALTROL) 0.25 MCG capsule Take 0.25 mcg by mouth daily.        Marland Kitchen levothyroxine (SYNTHROID, LEVOTHROID) 137 MCG tablet Take 137 mcg by mouth daily.        . Multiple Vitamins-Minerals (CENTRUM SILVER ULTRA WOMENS) TABS Take 1 tablet by mouth daily.        . rosuvastatin (CRESTOR) 10 MG tablet  Take 10 mg by mouth daily.          Allergies  Allergen Reactions  . Codeine   . Erythromycin   . Penicillins     Family History  Problem Relation Age of Onset  . Thyroid disease Sister     uncertain type  . Osteoporosis Other   . Cancer Other     FH of Prostate Cancer-1st degree relative    BP 110/80  Pulse 72  Temp(Src) 98.1 F (36.7 C) (Oral)  Ht 5\' 4"  (1.626 m)  Wt 204 lb 9.6 oz (92.806 kg)  BMI 35.12 kg/m2  SpO2 95%  Review of Systems Denies muscle cramps, and weight change.    Objective:   Physical Exam GENERAL: no distress Neck: a healed scar is present.  i do not appreciate a nodule in the thyroid or elsewhere in the neck   tg neg Lab Results  Component Value Date   PTH 8.6* 04/25/2011   CALCIUM 9.3 04/25/2011   Lab Results  Component Value Date   TSH 0.14* 04/25/2011   Assessment & Plan:  Stage-1 papillary adenocarcinoma of the thyroid, no evidence of recurrence. Postsurgical hypoparathyroidism, well-controlled Postsurgical hypothyroidism.  This tsh is adequate for this situation.

## 2011-04-25 NOTE — Patient Instructions (Addendum)
blood tests are being ordered for you today.  please call [phone] to hear your test results.  You will be prompted to enter the 9-digit "MRN" number that appears at the top left of this page, followed by #.  Then you will hear the message. Please make a follow-up appointment in 1 year. (update: i left message on phone-tree:  rx as we discussed)

## 2011-04-26 LAB — THYROGLOBULIN LEVEL: Thyroglobulin Ab: <20 U/mL (ref <40.0)

## 2011-04-26 LAB — PTH, INTACT AND CALCIUM
Calcium, Total (PTH): 9.3 mg/dL (ref 8.4–10.5)
PTH: 8.6 pg/mL — ABNORMAL LOW (ref 14.0–72.0)

## 2011-06-20 ENCOUNTER — Other Ambulatory Visit: Payer: Self-pay | Admitting: *Deleted

## 2011-06-20 MED ORDER — CALCITRIOL 0.25 MCG PO CAPS: 0.2500 ug | ORAL_CAPSULE | Freq: Every day | ORAL | Status: DC

## 2011-06-20 NOTE — Telephone Encounter (Signed)
R'cd fax from [facility] for refill of Calcitriol  Last OV-03/2011 Last filled-05/21/2011

## 2011-06-30 LAB — CBC
MCHC: 33.4
MCV: 89.2
Platelets: 253

## 2011-06-30 LAB — NO BLOOD PRODUCTS

## 2011-10-10 ENCOUNTER — Other Ambulatory Visit: Payer: Self-pay | Admitting: Endocrinology

## 2011-12-15 ENCOUNTER — Other Ambulatory Visit (INDEPENDENT_AMBULATORY_CARE_PROVIDER_SITE_OTHER): Payer: BC Managed Care – PPO

## 2011-12-15 ENCOUNTER — Ambulatory Visit (INDEPENDENT_AMBULATORY_CARE_PROVIDER_SITE_OTHER): Payer: BC Managed Care – PPO | Admitting: Endocrinology

## 2011-12-15 DIAGNOSIS — E890 Postprocedural hypothyroidism: Secondary | ICD-10-CM

## 2011-12-15 DIAGNOSIS — C73 Malignant neoplasm of thyroid gland: Secondary | ICD-10-CM

## 2011-12-15 DIAGNOSIS — E209 Hypoparathyroidism, unspecified: Secondary | ICD-10-CM

## 2011-12-15 NOTE — Patient Instructions (Addendum)
blood tests are being requested for you today.  You will receive a letter with results. pending the test results, please continue the same medications for now.  

## 2011-12-15 NOTE — Progress Notes (Signed)
  Subjective:    Patient ID: Peggy Wagner, female    DOB: [birth year], 56 y.o.   MRN: [id number]  HPI Pt says she feels no different, since she is back on the rocaltrol.  Denies cramps.  She does not notice any nodule at the neck She has 4 years of stage-1 papillary adenocarcinoma of the thyroid, confined to the neck, which is considered mild. She has assoc postsurgical hypoparathyroidism. 12/08   thyroidectomy--1.8 cm right papillary adeno ca. 7/09  i-131 109 mci 12/09  tg neg (ab neg) 1/10  thyrogen scan neg 6/10  tg neg (ab neg) 2/12: tg neg (ab neg) She has lost a few lbs, due to her efforts.   Past Medical History  Diagnosis Date  . CARCINOMA, THYROID GLAND, PAPILLARY 09/20/2007    12/08-thyroidectomy--1.8 cm right papillary adeno ca 07/09-I 131 109 mci  12/09 -tg neg (ab neg) 1/10-thyrogen scan neg 06/10-tg neg (ab neg) 02/12-tg neg (ab neg)  . HYPOTHYROIDISM, POSTSURGICAL 09/20/2007  . Hypoparathyroidism 09/24/2007  . Dizziness and giddiness 07/17/2008  . INSOMNIA 09/20/2007  . RECTAL BLEEDING, HX OF 05/20/2007  . Sleep apnea     No past surgical history on file.  History   Social History  . Marital Status: Single    Spouse Name: N/A    Number of Children: N/A  . Years of Education: N/A   Occupational History  . Teacher Sales executive     [employer]   Social History Main Topics  . Smoking status: Never Smoker   . Smokeless tobacco: Not on file  . Alcohol Use: Yes     Comment: occasional  . Drug Use: No  . Sexually Active:    Other Topics Concern  . Not on file   Social History Narrative   Regular exercise-no    Current Outpatient Prescriptions on File Prior to Visit  Medication Sig Dispense Refill  . Calcium Carbonate-Vitamin D (CALCIUM + D PO) Take 1 tablet by mouth daily.        . Multiple Vitamins-Minerals (CENTRUM SILVER ULTRA WOMENS) TABS Take 1 tablet by mouth daily.        . rosuvastatin (CRESTOR) 10 MG tablet Take 10 mg by mouth  daily.         No current facility-administered medications on file prior to visit.    Allergies  Allergen Reactions  . Codeine   . Erythromycin   . Penicillins     Family History  Problem Relation Age of Onset  . Thyroid disease Sister     uncertain type  . Osteoporosis Other   . Cancer Other     FH of Prostate Cancer-1st degree relative    There were no vitals taken for this visit.  Review of Systems Denies neck pain and numbness    Objective:   Physical Exam VITAL SIGNS:  See vs page GENERAL: no distress Neck: a healed scar is present.  i do not appreciate a nodule in the thyroid or elsewhere in the neck  (labs are reviewed)    Assessment & Plan:  Thyroid cancer, no evidence of recurrence Postsurgical hypothyroidism, well-replaced

## 2011-12-18 ENCOUNTER — Encounter: Payer: Self-pay | Admitting: Endocrinology

## 2011-12-18 LAB — THYROGLOBULIN ANTIBODY PANEL
Thyroglobulin Ab: <20 U/mL (ref <40.0)
Thyroglobulin: <0.2 ng/mL (ref 0.0–55.0)
Thyroperoxidase Ab SerPl-aCnc: <10 IU/mL (ref <35.0)

## 2011-12-20 ENCOUNTER — Telehealth: Payer: Self-pay | Admitting: *Deleted

## 2011-12-20 NOTE — Telephone Encounter (Signed)
Called pt to inform of result, left message for pt to callback office. (Letter also mailed to pt).

## 2011-12-21 NOTE — Telephone Encounter (Signed)
Pt informed of lab results. 

## 2012-01-22 ENCOUNTER — Other Ambulatory Visit: Payer: Self-pay | Admitting: Endocrinology

## 2012-07-03 ENCOUNTER — Other Ambulatory Visit: Payer: Self-pay | Admitting: General Practice

## 2012-07-03 MED ORDER — CALCITRIOL 0.25 MCG PO CAPS: 0.2500 ug | ORAL_CAPSULE | Freq: Every day | ORAL | Status: DC

## 2012-07-17 ENCOUNTER — Other Ambulatory Visit: Payer: Self-pay | Admitting: General Practice

## 2012-07-17 MED ORDER — LEVOTHYROXINE SODIUM 137 MCG PO TABS: ORAL_TABLET | ORAL | Status: DC

## 2012-08-15 ENCOUNTER — Other Ambulatory Visit: Payer: Self-pay | Admitting: *Deleted

## 2012-08-15 MED ORDER — LEVOTHYROXINE SODIUM 137 MCG PO TABS: ORAL_TABLET | ORAL | Status: DC

## 2012-08-15 NOTE — Telephone Encounter (Signed)
REFILL ON MEDICATION SYNTHROID SENT TO [facility]. PATIENT NOTIFIED OF THIS AND NEW PHONE # AND ADDRESS TO MAKE FOLLOW UP APPT. WITH DR. Everardo All FOR FURTHER REFILLS.

## 2012-09-05 ENCOUNTER — Ambulatory Visit: Payer: BC Managed Care – PPO | Admitting: Physical Therapy

## 2012-09-09 ENCOUNTER — Ambulatory Visit (INDEPENDENT_AMBULATORY_CARE_PROVIDER_SITE_OTHER): Payer: BC Managed Care – PPO | Admitting: Endocrinology

## 2012-09-09 ENCOUNTER — Encounter: Payer: Self-pay | Admitting: Endocrinology

## 2012-09-09 VITALS — BP 124/70 | HR 88 | Temp 97.8°F | Wt 207.0 lb

## 2012-09-09 DIAGNOSIS — E209 Hypoparathyroidism, unspecified: Secondary | ICD-10-CM

## 2012-09-09 DIAGNOSIS — E890 Postprocedural hypothyroidism: Secondary | ICD-10-CM

## 2012-09-09 DIAGNOSIS — C73 Malignant neoplasm of thyroid gland: Secondary | ICD-10-CM

## 2012-09-09 LAB — MAGNESIUM: Magnesium: 1.8 mg/dL (ref 1.5–2.5)

## 2012-09-09 NOTE — Patient Instructions (Addendum)
blood tests are being requested for you today.  We'll contact you with results.   Please return in 1 year.   

## 2012-09-09 NOTE — Progress Notes (Signed)
Subjective:    Patient ID: Peggy Wagner, female    DOB: 1956-03-23, 56 y.o.   MRN: [id number]  HPI The state of at least three ongoing medical problems is addressed today, with interval history of each noted here: Postsurgical hypothyroidism: Pt says she feels no different, since she is back on the rocaltrol.  Denies cramps.  She returns for f/u of stage-1 papillary adenocarcinoma of the thyroid.  She does not notice any nodule at the neck 12/08   thyroidectomy--1.8 cm right papillary adeno ca.   7/09  i-131 109 mci 12/09  tg neg (ab neg) 1/10  thyrogen scan neg 6/10  tg neg (ab neg) 2/12: tg neg (ab neg). 12/13: tg undetectable (ab neg) Postsurgical hypothyroidism: she denies weight change  Past Medical History  Diagnosis Date  . CARCINOMA, THYROID GLAND, PAPILLARY 09/20/2007    12/08-thyroidectomy--1.8 cm right papillary adeno ca 07/09-I 131 109 mci  12/09 -tg neg (ab neg) 1/10-thyrogen scan neg 06/10-tg neg (ab neg) 02/12-tg neg (ab neg)  . HYPOTHYROIDISM, POSTSURGICAL 09/20/2007  . Hypoparathyroidism 09/24/2007  . Dizziness and giddiness 07/17/2008  . INSOMNIA 09/20/2007  . RECTAL BLEEDING, HX OF 05/20/2007  . Sleep apnea     No past surgical history on file.  History   Social History  . Marital Status: Single    Spouse Name: N/A    Number of Children: N/A  . Years of Education: N/A   Occupational History  . Teacher Sales executive     [employer]   Social History Main Topics  . Smoking status: Never Smoker   . Smokeless tobacco: Not on file  . Alcohol Use: Yes     Comment: occasional  . Drug Use: No  . Sexually Active:    Other Topics Concern  . Not on file   Social History Narrative   Regular exercise-no    Current Outpatient Prescriptions on File Prior to Visit  Medication Sig Dispense Refill  . calcitRIOL (ROCALTROL) 0.25 MCG capsule Take 1 capsule (0.25 mcg total) by mouth daily.  30 capsule  9  . Calcium Carbonate-Vitamin D (CALCIUM  + D PO) Take 1 tablet by mouth daily.        Marland Kitchen levofloxacin (LEVAQUIN) 500 MG tablet Take 500 mg by mouth daily.       . Multiple Vitamins-Minerals (CENTRUM SILVER ULTRA WOMENS) TABS Take 1 tablet by mouth daily.        . rosuvastatin (CRESTOR) 10 MG tablet Take 10 mg by mouth daily.          Allergies  Allergen Reactions  . Codeine   . Erythromycin   . Penicillins     Family History  Problem Relation Age of Onset  . Thyroid disease Sister     uncertain type  . Osteoporosis Other   . Cancer Other     FH of Prostate Cancer-1st degree relative    BP 124/70  Pulse 88  Temp 97.8 F (36.6 C) (Oral)  Wt 207 lb (93.895 kg)  SpO2 97%  Review of Systems Denies neck pain and numbness    Objective:   Physical Exam VITAL SIGNS:  See vs page GENERAL: no distress Neck: a healed scar is present.  i do not appreciate a nodule in the thyroid or elsewhere in the neck.    Lab Results  Component Value Date   TSH 0.017* 09/09/2012  TG is undetectable (ab neg) Lab Results  Component Value Date   PTH 14.4 09/09/2012  CALCIUM 8.2* 09/09/2012      Assessment & Plan:  Postsurgical hypothyroidism, slightly overreplaced. Stage-1 thyroid cancer.  No evidence of recurrence. Postsurgical hypoparathyroidism, well-controlled.

## 2012-09-10 LAB — PTH, INTACT AND CALCIUM
Calcium, Total (PTH): 8.2 mg/dL — ABNORMAL LOW (ref 8.4–10.5)
PTH: 14.4 pg/mL (ref 14.0–72.0)

## 2012-09-11 MED ORDER — LEVOTHYROXINE SODIUM 125 MCG PO TABS: 125.0000 ug | ORAL_TABLET | Freq: Every day | ORAL | Status: DC

## 2012-09-15 MED ORDER — LEVOTHYROXINE SODIUM 125 MCG PO TABS: 125.0000 ug | ORAL_TABLET | Freq: Every day | ORAL | Status: DC

## 2012-09-24 ENCOUNTER — Ambulatory Visit: Payer: BC Managed Care – PPO | Attending: Family Medicine | Admitting: Physical Therapy

## 2012-09-24 DIAGNOSIS — M2569 Stiffness of other specified joint, not elsewhere classified: Secondary | ICD-10-CM | POA: Insufficient documentation

## 2012-09-24 DIAGNOSIS — IMO0001 Reserved for inherently not codable concepts without codable children: Secondary | ICD-10-CM | POA: Insufficient documentation

## 2012-09-24 DIAGNOSIS — M542 Cervicalgia: Secondary | ICD-10-CM | POA: Insufficient documentation

## 2012-10-01 ENCOUNTER — Encounter: Payer: Self-pay | Admitting: Endocrinology

## 2012-10-02 ENCOUNTER — Ambulatory Visit: Payer: No Typology Code available for payment source | Attending: Family Medicine | Admitting: Physical Therapy

## 2012-10-02 DIAGNOSIS — M2569 Stiffness of other specified joint, not elsewhere classified: Secondary | ICD-10-CM | POA: Insufficient documentation

## 2012-10-02 DIAGNOSIS — M542 Cervicalgia: Secondary | ICD-10-CM | POA: Insufficient documentation

## 2012-10-02 DIAGNOSIS — IMO0001 Reserved for inherently not codable concepts without codable children: Secondary | ICD-10-CM | POA: Insufficient documentation

## 2012-10-09 ENCOUNTER — Ambulatory Visit: Payer: No Typology Code available for payment source | Attending: Family Medicine | Admitting: Physical Therapy

## 2012-10-09 DIAGNOSIS — M542 Cervicalgia: Secondary | ICD-10-CM | POA: Insufficient documentation

## 2012-10-09 DIAGNOSIS — M2569 Stiffness of other specified joint, not elsewhere classified: Secondary | ICD-10-CM | POA: Insufficient documentation

## 2012-10-09 DIAGNOSIS — IMO0001 Reserved for inherently not codable concepts without codable children: Secondary | ICD-10-CM | POA: Insufficient documentation

## 2012-11-09 ENCOUNTER — Other Ambulatory Visit: Payer: Self-pay

## 2013-05-14 ENCOUNTER — Encounter: Payer: Self-pay | Admitting: Endocrinology

## 2013-05-14 ENCOUNTER — Ambulatory Visit (INDEPENDENT_AMBULATORY_CARE_PROVIDER_SITE_OTHER): Payer: BC Managed Care – PPO | Admitting: Endocrinology

## 2013-05-14 VITALS — BP 134/80 | HR 57 | Ht 64.0 in | Wt 206.0 lb

## 2013-05-14 DIAGNOSIS — E890 Postprocedural hypothyroidism: Secondary | ICD-10-CM

## 2013-05-14 DIAGNOSIS — C73 Malignant neoplasm of thyroid gland: Secondary | ICD-10-CM

## 2013-05-14 DIAGNOSIS — E209 Hypoparathyroidism, unspecified: Secondary | ICD-10-CM

## 2013-05-14 LAB — TSH: TSH: 0.16 u[IU]/mL — ABNORMAL LOW (ref 0.35–5.50)

## 2013-05-14 MED ORDER — LEVOTHYROXINE SODIUM 112 MCG PO TABS: 112.0000 ug | ORAL_TABLET | Freq: Every day | ORAL | Status: DC

## 2013-05-14 NOTE — Progress Notes (Signed)
Subjective:    Patient ID: Peggy Wagner, female    DOB: 1956/09/20, 57 y.o.   MRN: [id number]  HPI The state of at least three ongoing medical problems is addressed today, with interval history of each noted here: Postsurgical hypoparathyroidism:  Denies cramps.  She takes rocaltrol intermittently.   She returns for f/u of stage-1 papillary adenocarcinoma of the thyroid.  She does not notice any nodule at the neck 12/08   thyroidectomy--1.8 cm right papillary adeno ca (T1 N0 M0) 7/09  i-131 109 mci 12/09  tg neg (ab neg) 1/10  thyrogen scan neg 6/10  tg neg (ab neg) 2/12: tg neg (ab neg). 12/13: tg undetectable (ab neg) 8/14: tg undetectable (ab neg) Postsurgical hypothyroidism: she has lost a few lbs.   Past Medical History  Diagnosis Date  . CARCINOMA, THYROID GLAND, PAPILLARY 09/20/2007    12/08-thyroidectomy--1.8 cm right papillary adeno ca 07/09-I 131 109 mci  12/09 -tg neg (ab neg) 1/10-thyrogen scan neg 06/10-tg neg (ab neg) 02/12-tg neg (ab neg)  . HYPOTHYROIDISM, POSTSURGICAL 09/20/2007  . Hypoparathyroidism 09/24/2007  . Dizziness and giddiness 07/17/2008  . INSOMNIA 09/20/2007  . RECTAL BLEEDING, HX OF 05/20/2007  . Sleep apnea     No past surgical history on file.  History   Social History  . Marital Status: Single    Spouse Name: N/A    Number of Children: N/A  . Years of Education: N/A   Occupational History  . Teacher Sales executive     [employer]   Social History Main Topics  . Smoking status: Never Smoker   . Smokeless tobacco: Not on file  . Alcohol Use: Yes     Comment: occasional  . Drug Use: No  . Sexual Activity:    Other Topics Concern  . Not on file   Social History Narrative   Regular exercise-no    Current Outpatient Prescriptions on File Prior to Visit  Medication Sig Dispense Refill  . calcitRIOL (ROCALTROL) 0.25 MCG capsule Take 1 capsule (0.25 mcg total) by mouth daily.  30 capsule  9  . Calcium  Carbonate-Vitamin D (CALCIUM + D PO) Take 1 tablet by mouth daily.        Marland Kitchen etodolac (LODINE) 500 MG tablet Take 500 mg by mouth 2 (two) times daily.      Marland Kitchen levofloxacin (LEVAQUIN) 500 MG tablet Take 500 mg by mouth daily.       . Multiple Vitamins-Minerals (CENTRUM SILVER ULTRA WOMENS) TABS Take 1 tablet by mouth daily.        . rosuvastatin (CRESTOR) 10 MG tablet Take 10 mg by mouth daily.         No current facility-administered medications on file prior to visit.    Allergies  Allergen Reactions  . Codeine   . Erythromycin   . Penicillins     Family History  Problem Relation Age of Onset  . Thyroid disease Sister     uncertain type  . Osteoporosis Other   . Cancer Other     FH of Prostate Cancer-1st degree relative    BP 134/80  Pulse 57  Ht 5\' 4"  (1.626 m)  Wt 206 lb (93.441 kg)  BMI 35.34 kg/m2  SpO2 97%  Review of Systems She has nausea and unsteadiness on her feet.    Objective:   Physical Exam VITAL SIGNS:  See vs page GENERAL: no distress.  Neck: a healed scar is present.  i do not appreciate a  nodule in the thyroid or elsewhere in the neck  Lab Results  Component Value Date   PTH 14.5 05/14/2013   CALCIUM 9.7 05/14/2013   Lab Results  Component Value Date   TSH 0.16* 05/14/2013      Assessment & Plan:  Thyroid cancer.  no evidence of recurrence Postsurgical hypothyroidism, overreplaced Postsurgical hypoparathyroidism, overcontrolled.

## 2013-05-14 NOTE — Patient Instructions (Addendum)
blood tests are being requested for you today.  We'll contact you with results.   Please return in 1 year.   

## 2013-05-15 LAB — PTH, INTACT AND CALCIUM
Calcium: 9.7 mg/dL (ref 8.4–10.5)
PTH: 14.5 pg/mL (ref 14.0–72.0)

## 2013-05-17 ENCOUNTER — Telehealth: Payer: Self-pay | Admitting: Endocrinology

## 2013-05-17 NOTE — Telephone Encounter (Signed)
Please call pt Please reduce the calcitriol to 1 tab 3 times a week (m, f, w)

## 2013-05-19 NOTE — Telephone Encounter (Signed)
Left message, pt to call if any questions 

## 2013-07-10 ENCOUNTER — Encounter: Payer: Self-pay | Admitting: Endocrinology

## 2013-07-31 ENCOUNTER — Other Ambulatory Visit: Payer: Self-pay

## 2013-09-07 ENCOUNTER — Other Ambulatory Visit: Payer: Self-pay | Admitting: Endocrinology

## 2013-09-09 ENCOUNTER — Telehealth: Payer: Self-pay | Admitting: *Deleted

## 2013-09-09 NOTE — Telephone Encounter (Signed)
Patient called and did not leave reason for her call. I returned call and left a message for [place] to please call us back. [id number]//[id number]

## 2014-04-13 ENCOUNTER — Encounter: Payer: Self-pay | Admitting: Endocrinology

## 2014-04-13 ENCOUNTER — Ambulatory Visit (INDEPENDENT_AMBULATORY_CARE_PROVIDER_SITE_OTHER): Payer: BC Managed Care – PPO | Admitting: Endocrinology

## 2014-04-13 VITALS — BP 118/78 | HR 87 | Temp 98.6°F | Ht 64.0 in | Wt 211.0 lb

## 2014-04-13 DIAGNOSIS — E209 Hypoparathyroidism, unspecified: Secondary | ICD-10-CM

## 2014-04-13 DIAGNOSIS — C73 Malignant neoplasm of thyroid gland: Secondary | ICD-10-CM

## 2014-04-13 DIAGNOSIS — E890 Postprocedural hypothyroidism: Secondary | ICD-10-CM

## 2014-04-13 LAB — TSH: TSH: 0.05 u[IU]/mL — ABNORMAL LOW (ref 0.35–4.50)

## 2014-04-13 MED ORDER — LEVOTHYROXINE SODIUM 100 MCG PO TABS: 100.0000 ug | ORAL_TABLET | Freq: Every day | ORAL | Status: DC

## 2014-04-13 NOTE — Progress Notes (Signed)
Subjective:    Patient ID: Peggy Wagner, female    DOB: 03/15/1956, 58 y.o.   MRN: [id number]  HPI The state of at least three ongoing medical problems is addressed today, with interval history of each noted here: Postsurgical hypoparathyroidism:  Denies cramps.  She takes rocaltrol QOD. She returns for f/u of stage-1 papillary adenocarcinoma of the thyroid.  She does not notice any nodule at the neck.   12/08   thyroidectomy--1.8 cm right papillary adeno ca (T1 N0 M0).  7/09  i-131 109 mci 12/09  tg neg (ab neg) 1/10  thyrogen scan neg 6/10  tg neg (ab neg) 2/12: tg neg (ab neg). 12/13: tg undetectable (ab neg) 8/14: tg undetectable (ab neg) Postsurgical hypothyroidism: she has slight separation of the fingernails from the nailbed, and assoc fatigue. Past Medical History  Diagnosis Date  . CARCINOMA, THYROID GLAND, PAPILLARY 09/20/2007    12/08-thyroidectomy--1.8 cm right papillary adeno ca 07/09-I 131 109 mci  12/09 -tg neg (ab neg) 1/10-thyrogen scan neg 06/10-tg neg (ab neg) 02/12-tg neg (ab neg)  . HYPOTHYROIDISM, POSTSURGICAL 09/20/2007  . Hypoparathyroidism 09/24/2007  . Dizziness and giddiness 07/17/2008  . INSOMNIA 09/20/2007  . RECTAL BLEEDING, HX OF 05/20/2007  . Sleep apnea     No past surgical history on file.  History   Social History  . Marital Status: Single    Spouse Name: N/A    Number of Children: N/A  . Years of Education: N/A   Occupational History  . 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 Smoking status: Never Smoker   . Smokeless tobacco: Not on file  . Alcohol Use: Yes     Comment: occasional  . Drug Use: No  . Sexual Activity:    Other Topics Concern  . Not on file   Social History Narrative   Regular exercise-no    Current Outpatient Prescriptions on File Prior to Visit  Medication Sig Dispense Refill  . Calcium Carbonate-Vitamin D (CALCIUM + D PO) Take 1 tablet by mouth daily.         Marland Kitchen etodolac (LODINE) 500 MG tablet Take 500 mg by mouth 2 (two) times daily.      . Multiple Vitamins-Minerals (CENTRUM SILVER ULTRA WOMENS) TABS Take 1 tablet by mouth daily.        . rosuvastatin (CRESTOR) 10 MG tablet Take 10 mg by mouth daily.         No current facility-administered medications on file prior to visit.    Allergies  Allergen Reactions  . Codeine   . Erythromycin   . Penicillins     Family History  Problem Relation Age of Onset  . Thyroid disease Sister     uncertain type  . Osteoporosis Other   . Cancer Other     FH of Prostate Cancer-1st degree relative    BP 118/78  Pulse 87  Temp(Src) 98.6 F (37 C) (Oral)  Ht 5\' 4"  (1.626 m)  Wt 211 lb (95.709 kg)  BMI 36.20 kg/m2  SpO2 95%   Review of Systems She has decreased appetite.  Denies weight change.  She has intermittent headache.    Objective:   Physical Exam VITAL SIGNS:  See vs page GENERAL: no distress Neck: a healed scar is present.  i do not appreciate a nodule in the thyroid or elsewhere in the neck Fingernails: normal  Lab Results  Component Value Date  TSH 0.05* 04/13/2014       Assessment & Plan:  Hypothyroidism: overreplaced Thyroid cancer: low-risk for recurrence Hypocalcemia: due for recheck. Onycholysis: new.  Could be thyroid-related.   Patient is advised the following: Patient Instructions  blood tests are being requested for you today.  We'll contact you with results. Please return in 1 year.

## 2014-04-13 NOTE — Patient Instructions (Signed)
blood tests are being requested for you today.  We'll contact you with results.   Please return in 1 year.   

## 2014-04-14 LAB — THYROGLOBULIN LEVEL: Thyroglobulin: <0.2 ng/mL (ref 0.0–55.0)

## 2014-04-14 LAB — THYROGLOBULIN ANTIBODY

## 2014-04-14 LAB — PTH, INTACT AND CALCIUM
Calcium: 8.2 mg/dL — ABNORMAL LOW (ref 8.4–10.5)
PTH: 19.6 pg/mL (ref 14.0–72.0)

## 2014-04-16 ENCOUNTER — Other Ambulatory Visit: Payer: Self-pay

## 2014-04-17 ENCOUNTER — Telehealth: Payer: Self-pay | Admitting: Endocrinology

## 2014-04-17 NOTE — Telephone Encounter (Signed)
Pt would like her lab results please.

## 2014-04-17 NOTE — Telephone Encounter (Signed)
Pt advised of lab results.  

## 2014-06-16 ENCOUNTER — Other Ambulatory Visit: Payer: Self-pay | Admitting: Endocrinology

## 2015-01-13 ENCOUNTER — Encounter: Payer: Self-pay | Admitting: Endocrinology

## 2015-01-13 ENCOUNTER — Ambulatory Visit (INDEPENDENT_AMBULATORY_CARE_PROVIDER_SITE_OTHER): Payer: BC Managed Care – PPO | Admitting: Endocrinology

## 2015-01-13 VITALS — BP 142/90 | HR 92 | Temp 98.1°F | Ht 64.0 in | Wt 219.0 lb

## 2015-01-13 DIAGNOSIS — E209 Hypoparathyroidism, unspecified: Secondary | ICD-10-CM

## 2015-01-13 DIAGNOSIS — E890 Postprocedural hypothyroidism: Secondary | ICD-10-CM

## 2015-01-13 DIAGNOSIS — C73 Malignant neoplasm of thyroid gland: Secondary | ICD-10-CM

## 2015-01-13 NOTE — Patient Instructions (Addendum)
blood tests are being requested for you today.  We'll contact you with results.   Please return in 1 year.   

## 2015-01-13 NOTE — Progress Notes (Signed)
Subjective:    Patient ID: Peggy Wagner, female    DOB: Jan 26, 1956, 59 y.o.   MRN: [id number]  HPI The state of at least three ongoing medical problems is addressed today, with interval history of each noted here: She returns for f/u of stage-1 papillary adenocarcinoma of the thyroid.  She does not notice any nodule at the neck.   12/08   thyroidectomy--1.8 cm right papillary adeno ca (T1 N0 M0).  7/09  i-131 109 mci 12/09  tg neg (ab neg) 1/10  thyrogen scan neg 6/10  tg neg (ab neg) 2/12: tg neg (ab neg). 12/13: tg undetectable (ab neg) 8/14: tg undetectable (ab neg) Postsurgical hypothyroidism: she has constipation. Denies cramps Past Medical History  Diagnosis Date  . CARCINOMA, THYROID GLAND, PAPILLARY 09/20/2007    12/08-thyroidectomy--1.8 cm right papillary adeno ca 07/09-I 131 109 mci  12/09 -tg neg (ab neg) 1/10-thyrogen scan neg 06/10-tg neg (ab neg) 02/12-tg neg (ab neg)  . HYPOTHYROIDISM, POSTSURGICAL 09/20/2007  . Hypoparathyroidism 09/24/2007  . Dizziness and giddiness 07/17/2008  . INSOMNIA 09/20/2007  . RECTAL BLEEDING, HX OF 05/20/2007  . Sleep apnea     No past surgical history on file.  History   Social History  . Marital Status: Single    Spouse Name: N/A  . Number of Children: N/A  . Years of Education: N/A   Occupational History  . 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 Smoking status: Never Smoker   . Smokeless tobacco: Not on file  . Alcohol Use: Yes     Comment: occasional  . Drug Use: No  . Sexual Activity: Not on file   Other Topics Concern  . Not on file   Social History Narrative   Regular exercise-no    Current Outpatient Prescriptions on File Prior to Visit  Medication Sig Dispense Refill  . calcitRIOL (ROCALTROL) 0.25 MCG capsule TAKE (1) CAPSULE DAILY. 30 capsule 2  . Calcium Carbonate-Vitamin D (CALCIUM + D PO) Take 1 tablet by mouth daily.      Marland Kitchen etodolac (LODINE) 500 MG  tablet Take 500 mg by mouth 2 (two) times daily.    Marland Kitchen levothyroxine (SYNTHROID, LEVOTHROID) 100 MCG tablet Take 1 tablet (100 mcg total) by mouth daily before breakfast. 30 tablet 11  . Magnesium 100 MG CAPS Take by mouth.    . Multiple Vitamins-Minerals (CENTRUM SILVER ULTRA WOMENS) TABS Take 1 tablet by mouth daily.      . rosuvastatin (CRESTOR) 10 MG tablet Take 10 mg by mouth daily.       No current facility-administered medications on file prior to visit.    Allergies  Allergen Reactions  . Codeine   . Erythromycin   . Penicillins     Family History  Problem Relation Age of Onset  . Thyroid disease Sister     uncertain type  . Osteoporosis Other   . Cancer Other     FH of Prostate Cancer-1st degree relative    BP 142/90 mmHg  Pulse 92  Temp(Src) 98.1 F (36.7 C) (Oral)  Ht 5\' 4"  (1.626 m)  Wt 219 lb (99.338 kg)  BMI 37.57 kg/m2  SpO2 97%    Review of Systems She has mild depression.  She has hair loss.     Objective:   Physical Exam VITAL SIGNS:  See vs page GENERAL: no distress Neck: a healed scar is present.  i do not appreciate  a nodule in the thyroid or elsewhere in the neck.    Lab Results  Component Value Date   TSH 0.83 01/13/2015   Vit-D=normal    Assessment & Plan:  postsurgical hypothyroidism: well-replaced.  In view of long cancer-free interval, we can dose synthroid to a normal TSH. Postsurgical hypoparathyroidism: on rx, and due for recheck today. Thyroid cancer: no clinical evidence of recurrence.     Patient is advised the following: Patient Instructions  blood tests are being requested for you today.  We'll contact you with results. Please return in 1 year.    addendum: Please continue the same synthroid.

## 2015-01-14 LAB — MAGNESIUM: Magnesium: 2 mg/dL (ref 1.5–2.5)

## 2015-01-14 LAB — TSH: TSH: 0.83 u[IU]/mL (ref 0.35–4.50)

## 2015-01-14 LAB — VITAMIN D 25 HYDROXY (VIT D DEFICIENCY, FRACTURES): VITD: 34.17 ng/mL (ref 30.00–100.00)

## 2015-01-14 LAB — PHOSPHORUS: Phosphorus: 3.4 mg/dL (ref 2.3–4.6)

## 2015-01-15 LAB — PTH, INTACT AND CALCIUM
Calcium: 8.6 mg/dL (ref 8.4–10.5)
PTH: 20 pg/mL (ref 14–64)

## 2015-01-15 LAB — THYROGLOBULIN ANTIBODY: Thyroglobulin Ab: <1 IU/mL (ref <2)

## 2015-01-15 LAB — THYROGLOBULIN LEVEL: Thyroglobulin: <0.1 ng/mL — ABNORMAL LOW (ref 2.8–40.9)

## 2015-01-19 ENCOUNTER — Other Ambulatory Visit: Payer: Self-pay

## 2015-01-19 MED ORDER — LEVOTHYROXINE SODIUM 100 MCG PO TABS: 100.0000 ug | ORAL_TABLET | Freq: Every day | ORAL | Status: DC

## 2015-01-20 ENCOUNTER — Ambulatory Visit: Payer: Self-pay | Admitting: Endocrinology

## 2015-01-21 ENCOUNTER — Other Ambulatory Visit: Payer: Self-pay | Admitting: Endocrinology

## 2015-05-30 ENCOUNTER — Other Ambulatory Visit: Payer: Self-pay | Admitting: Endocrinology

## 2015-08-17 ENCOUNTER — Other Ambulatory Visit: Payer: Self-pay | Admitting: Endocrinology

## 2015-11-10 ENCOUNTER — Other Ambulatory Visit: Payer: Self-pay | Admitting: Endocrinology

## 2015-12-10 ENCOUNTER — Other Ambulatory Visit: Payer: Self-pay | Admitting: Endocrinology

## 2016-01-10 ENCOUNTER — Other Ambulatory Visit: Payer: Self-pay | Admitting: Endocrinology

## 2016-02-11 ENCOUNTER — Other Ambulatory Visit: Payer: Self-pay | Admitting: Endocrinology

## 2016-02-15 ENCOUNTER — Encounter: Payer: Self-pay | Admitting: Endocrinology

## 2016-02-15 ENCOUNTER — Ambulatory Visit (INDEPENDENT_AMBULATORY_CARE_PROVIDER_SITE_OTHER): Payer: BC Managed Care – PPO | Admitting: Endocrinology

## 2016-02-15 VITALS — BP 136/88 | HR 83 | Temp 98.2°F | Ht 64.0 in | Wt 220.0 lb

## 2016-02-15 DIAGNOSIS — C73 Malignant neoplasm of thyroid gland: Secondary | ICD-10-CM

## 2016-02-15 DIAGNOSIS — E890 Postprocedural hypothyroidism: Secondary | ICD-10-CM | POA: Diagnosis not present

## 2016-02-15 DIAGNOSIS — E209 Hypoparathyroidism, unspecified: Secondary | ICD-10-CM | POA: Diagnosis not present

## 2016-02-15 LAB — TSH: TSH: 5.25 u[IU]/mL — ABNORMAL HIGH (ref 0.35–4.50)

## 2016-02-15 MED ORDER — LEVOTHYROXINE SODIUM 112 MCG PO TABS: 112.0000 ug | ORAL_TABLET | Freq: Every day | ORAL | Status: DC

## 2016-02-15 NOTE — Patient Instructions (Addendum)
blood tests are requested for you today.  We'll let you know about the results.   Please return in 1 year.  

## 2016-02-15 NOTE — Progress Notes (Signed)
Subjective:    Patient ID: Peggy Wagner, female    DOB: 1955-11-10, 60 y.o.   MRN: [id number]  HPI The state of at least three ongoing medical problems is addressed today, with interval history of each noted here: She returns for f/u of stage-1 papillary adenocarcinoma of the thyroid.  She does not notice any nodule at the neck.   12/08   thyroidectomy--1.8 cm right papillary adeno ca (T1 N0 M0).  7/09  i-131 109 mci 12/09  tg neg (ab neg) 1/10  thyrogen scan neg 6/10  tg neg (ab neg) 2/12: tg neg (ab neg). 12/13: tg undetectable (ab neg) 8/14: tg undetectable (ab neg) 4/16: tg undetectable (ab neg) Postsurgical hypothyroidism: she has gained weight.  Hypoparathyroidism: she denies cramps.   Past Medical History  Diagnosis Date  . CARCINOMA, THYROID GLAND, PAPILLARY 09/20/2007    12/08-thyroidectomy--1.8 cm right papillary adeno ca 07/09-I 131 109 mci  12/09 -tg neg (ab neg) 1/10-thyrogen scan neg 06/10-tg neg (ab neg) 02/12-tg neg (ab neg)  . HYPOTHYROIDISM, POSTSURGICAL 09/20/2007  . Hypoparathyroidism ([place]) 09/24/2007  . Dizziness and giddiness 07/17/2008  . INSOMNIA 09/20/2007  . RECTAL BLEEDING, HX OF 05/20/2007  . Sleep apnea     No past surgical history on file.  Social History   Social History  . Marital Status: Single    Spouse Name: N/A  . Number of Children: N/A  . Years of Education: N/A   Occupational History  . 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 Smoking status: Never Smoker   . Smokeless tobacco: Not on file  . Alcohol Use: Yes     Comment: occasional  . Drug Use: No  . Sexual Activity: Not on file   Other Topics Concern  . Not on file   Social History Narrative   Regular exercise-no    Current Outpatient Prescriptions on File Prior to Visit  Medication Sig Dispense Refill  . calcitRIOL (ROCALTROL) 0.25 MCG capsule TAKE (1) CAPSULE DAILY. 30 capsule 0  . Calcium Carbonate-Vitamin D  (CALCIUM + D PO) Take 1 tablet by mouth daily.      . Multiple Vitamins-Minerals (CENTRUM SILVER ULTRA WOMENS) TABS Take 1 tablet by mouth daily.      . rosuvastatin (CRESTOR) 10 MG tablet Take 10 mg by mouth daily.      Marland Kitchen etodolac (LODINE) 500 MG tablet Take 500 mg by mouth 2 (two) times daily. Reported on 02/15/2016    . Magnesium 100 MG CAPS Take by mouth. Reported on 02/15/2016     No current facility-administered medications on file prior to visit.    Allergies  Allergen Reactions  . Codeine   . Erythromycin   . Penicillins     Family History  Problem Relation Age of Onset  . Thyroid disease Sister     uncertain type  . Osteoporosis Other   . Cancer Other     FH of Prostate Cancer-1st degree relative    BP 136/88 mmHg  Pulse 83  Temp(Src) 98.2 F (36.8 C) (Oral)  Ht 5\' 4"  (1.626 m)  Wt 220 lb (99.791 kg)  BMI 37.74 kg/m2  SpO2 93%  Review of Systems Denies numbness and edema    Objective:   Physical Exam VITAL SIGNS:  See vs page GENERAL: no distress Neck: a healed scar is present.  i do not appreciate a nodule in the thyroid or elsewhere in the neck.  Lab Results  Component Value Date   PTH 23 02/15/2016   CALCIUM 8.0* 02/15/2016   PHOS 3.4 01/13/2015   Lab Results  Component Value Date   TSH 5.25* 02/15/2016   TG=undetectable    Assessment & Plan:  Hypoparathyroidism: well-controlled, as this is in the goal Ca++ of 8-9.  Hypothyroidism: she needs increased rx.  Differentiated thyroid cancer, no evidence of recurrence.    Patient is advised the following: Patient Instructions  blood tests are requested for you today.  We'll let you know about the results.  Please return in 1 year.   addendum: i have sent a prescription to your pharmacy, to increase synthroid

## 2016-02-16 LAB — THYROGLOBULIN LEVEL: Thyroglobulin: 0.1 ng/mL — ABNORMAL LOW (ref 2.8–40.9)

## 2016-02-16 LAB — PTH, INTACT AND CALCIUM
CALCIUM: 8 mg/dL — AB (ref 8.4–10.5)
PTH: 23 pg/mL (ref 14–64)

## 2016-02-16 LAB — THYROGLOBULIN ANTIBODY: Thyroglobulin Ab: <1 IU/mL (ref <2)

## 2016-02-18 ENCOUNTER — Other Ambulatory Visit: Payer: Self-pay | Admitting: Endocrinology

## 2016-08-03 ENCOUNTER — Other Ambulatory Visit (INDEPENDENT_AMBULATORY_CARE_PROVIDER_SITE_OTHER): Payer: BC Managed Care – PPO

## 2016-08-03 ENCOUNTER — Telehealth: Payer: Self-pay | Admitting: Endocrinology

## 2016-08-03 DIAGNOSIS — E209 Hypoparathyroidism, unspecified: Secondary | ICD-10-CM

## 2016-08-03 DIAGNOSIS — E890 Postprocedural hypothyroidism: Secondary | ICD-10-CM

## 2016-08-03 LAB — VITAMIN D 25 HYDROXY (VIT D DEFICIENCY, FRACTURES): VITD: 27.89 ng/mL — ABNORMAL LOW (ref 30.00–100.00)

## 2016-08-03 LAB — TSH: TSH: 0.35 u[IU]/mL (ref 0.35–4.50)

## 2016-08-03 NOTE — Telephone Encounter (Signed)
See message and please advise, Thanks!  

## 2016-08-03 NOTE — Telephone Encounter (Signed)
I ordered.

## 2016-08-03 NOTE — Telephone Encounter (Signed)
I contacted the patient and advised of message. Requested a call back from the patient to schedule her lab appointment.

## 2016-08-03 NOTE — Telephone Encounter (Signed)
Pt called in requesting to have some of her levels checked, she said she wanted to make sure she is still on the correct dose of Synthroid and also her calcium.  Requests call back.

## 2016-08-04 LAB — PTH, INTACT AND CALCIUM
CALCIUM: 9 mg/dL (ref 8.6–10.4)
PTH: 15 pg/mL (ref 14–64)

## 2016-08-08 ENCOUNTER — Encounter: Payer: Self-pay | Admitting: Endocrinology

## 2016-08-10 ENCOUNTER — Other Ambulatory Visit: Payer: Self-pay

## 2016-08-10 MED ORDER — CALCITRIOL 0.25 MCG PO CAPS: ORAL_CAPSULE | ORAL | 3 refills | Status: AC

## 2017-02-05 ENCOUNTER — Other Ambulatory Visit: Payer: Self-pay | Admitting: Endocrinology

## 2017-02-05 NOTE — Telephone Encounter (Signed)
Please refill x 1 Ov is due  

## 2017-03-10 ENCOUNTER — Other Ambulatory Visit: Payer: Self-pay | Admitting: Endocrinology

## 2017-03-10 NOTE — Telephone Encounter (Signed)
Please refill x 3 mos Ov is due 

## 2017-03-12 ENCOUNTER — Telehealth: Payer: Self-pay | Admitting: Internal Medicine

## 2017-03-12 NOTE — Telephone Encounter (Signed)
**  Remind patient they can make refill requests via MyChart**  Medication refill request (Name & Dosage):    SYNTHROID 112 MCG tablet  Preferred pharmacy (Name & Address):  [facility], [place]  Other comments (if applicable):

## 2017-03-13 MED ORDER — LEVOTHYROXINE SODIUM 112 MCG PO TABS: 112.0000 ug | ORAL_TABLET | Freq: Every day | ORAL | 0 refills | Status: DC

## 2017-03-13 NOTE — Telephone Encounter (Signed)
Refill submitted. 

## 2017-03-22 ENCOUNTER — Ambulatory Visit (INDEPENDENT_AMBULATORY_CARE_PROVIDER_SITE_OTHER): Payer: BC Managed Care – PPO | Admitting: Endocrinology

## 2017-03-22 ENCOUNTER — Encounter: Payer: Self-pay | Admitting: Endocrinology

## 2017-03-22 VITALS — BP 130/86 | HR 83 | Ht 64.0 in | Wt 211.0 lb

## 2017-03-22 DIAGNOSIS — E890 Postprocedural hypothyroidism: Secondary | ICD-10-CM

## 2017-03-22 DIAGNOSIS — E209 Hypoparathyroidism, unspecified: Secondary | ICD-10-CM | POA: Diagnosis not present

## 2017-03-22 LAB — TSH: TSH: 0.07 u[IU]/mL — ABNORMAL LOW (ref 0.35–4.50)

## 2017-03-22 MED ORDER — LEVOTHYROXINE SODIUM 100 MCG PO TABS: 100.0000 ug | ORAL_TABLET | Freq: Every day | ORAL | 3 refills | Status: DC

## 2017-03-22 NOTE — Progress Notes (Signed)
Subjective:    Patient ID: Peggy Wagner, female    DOB: December 22, 1955, 61 y.o.   MRN: [id number]  HPI The state of at least three ongoing medical problems is addressed today, with interval history of each noted here: She returns for f/u of stage-1 papillary adenocarcinoma of the thyroid.  She does not notice any nodule at the neck.  Stable problem 12/08   thyroidectomy--1.8 cm right papillary adeno ca (T1 N0 M0).  7/09  i-131 109 [facility] 12/09  tg neg (ab neg) 1/10  thyrogen scan neg 6/10  tg neg (ab neg) 2/12: tg neg (ab neg). 12/13: tg undetectable (ab neg) 8/14: tg undetectable (ab neg) 4/16: tg undetectable (ab neg) Postsurgical hypothyroidism: she has dry skin.  Stable problem Hypoparathyroidism: she denies cramps.  Stable problem Past Medical History:  Diagnosis Date  . CARCINOMA, THYROID GLAND, PAPILLARY 09/20/2007   12/08-thyroidectomy--1.8 cm right papillary adeno ca 07/09-I 131 109 [facility]  12/09 -tg neg (ab neg) 1/10-thyrogen scan neg 06/10-tg neg (ab neg) 02/12-tg neg (ab neg)  . Dizziness and giddiness 07/17/2008  . Hypoparathyroidism ([place]) 09/24/2007  . HYPOTHYROIDISM, POSTSURGICAL 09/20/2007  . INSOMNIA 09/20/2007  . RECTAL BLEEDING, HX OF 05/20/2007  . Sleep apnea     No past surgical history on file.  Social History   Social History  . Marital status: Single    Spouse name: N/A  . Number of children: N/A  . Years of education: N/A   Occupational History  . 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 Smoking status: Never Smoker  . Smokeless tobacco: Never Used  . Alcohol use Yes     Comment: occasional  . Drug use: No  . Sexual activity: Not on file   Other Topics Concern  . Not on file   Social History Narrative   Regular exercise-no    Current Outpatient Prescriptions on File Prior to Visit  Medication Sig Dispense Refill  . b complex vitamins capsule Take 1 capsule by mouth daily.    . calcitRIOL  (ROCALTROL) 0.25 MCG capsule TAKE (1) CAPSULE DAILY. 30 capsule 3  . Calcium Carbonate-Vitamin D (CALCIUM + D PO) Take 1 tablet by mouth daily.      . Multiple Vitamins-Minerals (CENTRUM SILVER ULTRA WOMENS) TABS Take 1 tablet by mouth daily.      . rosuvastatin (CRESTOR) 10 MG tablet Take 10 mg by mouth daily.       No current facility-administered medications on file prior to visit.     Allergies  Allergen Reactions  . Codeine   . Erythromycin   . Penicillins     Family History  Problem Relation Age of Onset  . Thyroid disease Sister        uncertain type  . Osteoporosis Other   . Cancer Other        FH of Prostate Cancer-1st degree relative    BP 130/86   Pulse 83   Ht 5\' 4"  (1.626 m)   Wt 211 lb (95.7 kg)   SpO2 94%   BMI 36.22 kg/m    Review of Systems She has constipation, but no numbness    Objective:   Physical Exam VITAL SIGNS:  See vs page GENERAL: no distress Neck: a healed scar is present.  i do not appreciate a nodule in the thyroid or elsewhere in the neck     Assessment & Plan:  Hypoparathyroidism: due for recheck  Hypothyroidism: due for recheck Differentiated thyroid cancer, no evidence of recurrence.  She wants to skip TG level  Patient Instructions  blood tests are requested for you today.  We'll let you know about the results.  Please let me know if you want to take the once-a-day injection to replaced the low parathyroid level.  Here is some information about it.   Please return in 1 year.

## 2017-03-22 NOTE — Patient Instructions (Addendum)
blood tests are requested for you today.  We'll let you know about the results.  Please let me know if you want to take the once-a-day injection to replaced the low parathyroid level.  Here is some information about it.   Please return in 1 year.

## 2017-03-23 LAB — PTH, INTACT AND CALCIUM
CALCIUM: 7.9 mg/dL — AB (ref 8.6–10.4)
PTH: 20 pg/mL (ref 14–64)

## 2017-03-23 MED ORDER — LEVOTHYROXINE SODIUM 88 MCG PO TABS: 88.0000 ug | ORAL_TABLET | Freq: Every day | ORAL | 3 refills | Status: DC

## 2017-04-25 ENCOUNTER — Encounter: Payer: Self-pay | Admitting: Endocrinology

## 2017-04-26 ENCOUNTER — Other Ambulatory Visit: Payer: Self-pay

## 2017-04-26 MED ORDER — LEVOTHYROXINE SODIUM 88 MCG PO TABS: 88.0000 ug | ORAL_TABLET | Freq: Every day | ORAL | 11 refills | Status: DC

## 2017-09-10 ENCOUNTER — Other Ambulatory Visit: Payer: Self-pay | Admitting: Internal Medicine

## 2017-09-10 DIAGNOSIS — Z85850 Personal history of malignant neoplasm of thyroid: Secondary | ICD-10-CM

## 2017-09-10 DIAGNOSIS — E890 Postprocedural hypothyroidism: Secondary | ICD-10-CM

## 2017-09-14 ENCOUNTER — Ambulatory Visit
Admission: RE | Admit: 2017-09-14 | Discharge: 2017-09-14 | Disposition: A | Discharge status: Home / Self Care | Payer: BC Managed Care – PPO | Source: Ambulatory Visit | Attending: Internal Medicine | Admitting: Internal Medicine

## 2017-09-14 ENCOUNTER — Other Ambulatory Visit: Payer: Self-pay

## 2017-09-14 DIAGNOSIS — Z85850 Personal history of malignant neoplasm of thyroid: Secondary | ICD-10-CM

## 2017-09-14 DIAGNOSIS — E890 Postprocedural hypothyroidism: Secondary | ICD-10-CM

## 2017-10-11 ENCOUNTER — Other Ambulatory Visit: Payer: Self-pay | Admitting: Internal Medicine

## 2017-10-11 DIAGNOSIS — Z85850 Personal history of malignant neoplasm of thyroid: Secondary | ICD-10-CM

## 2018-03-15 ENCOUNTER — Ambulatory Visit
Admission: RE | Admit: 2018-03-15 | Discharge: 2018-03-15 | Disposition: A | Discharge status: Home / Self Care | Payer: BC Managed Care – PPO | Source: Ambulatory Visit | Attending: Internal Medicine | Admitting: Internal Medicine

## 2018-03-15 DIAGNOSIS — Z85850 Personal history of malignant neoplasm of thyroid: Secondary | ICD-10-CM

## 2020-01-13 ENCOUNTER — Other Ambulatory Visit: Payer: Self-pay | Admitting: Obstetrics and Gynecology

## 2020-01-13 DIAGNOSIS — N950 Postmenopausal bleeding: Secondary | ICD-10-CM

## 2020-01-16 ENCOUNTER — Ambulatory Visit
Admission: RE | Admit: 2020-01-16 | Discharge: 2020-01-16 | Disposition: A | Discharge status: Home / Self Care | Payer: BC Managed Care – PPO | Source: Ambulatory Visit | Attending: Obstetrics and Gynecology | Admitting: Obstetrics and Gynecology

## 2020-01-16 DIAGNOSIS — N950 Postmenopausal bleeding: Secondary | ICD-10-CM

## 2020-02-16 ENCOUNTER — Other Ambulatory Visit: Payer: Self-pay | Admitting: Internal Medicine

## 2020-02-16 ENCOUNTER — Ambulatory Visit
Admission: RE | Admit: 2020-02-16 | Discharge: 2020-02-16 | Disposition: A | Discharge status: Home / Self Care | Payer: BC Managed Care – PPO | Source: Ambulatory Visit | Attending: Internal Medicine | Admitting: Internal Medicine

## 2020-02-16 DIAGNOSIS — M545 Low back pain, unspecified: Secondary | ICD-10-CM

## 2020-06-30 ENCOUNTER — Encounter (HOSPITAL_BASED_OUTPATIENT_CLINIC_OR_DEPARTMENT_OTHER): Payer: Self-pay

## 2020-06-30 ENCOUNTER — Ambulatory Visit (HOSPITAL_BASED_OUTPATIENT_CLINIC_OR_DEPARTMENT_OTHER): Admit: 2020-06-30 | Payer: BC Managed Care – PPO | Admitting: Obstetrics and Gynecology

## 2020-06-30 SURGERY — DILATATION & CURETTAGE/HYSTEROSCOPY WITH MYOSURE
Anesthesia: Choice

## 2020-09-08 ENCOUNTER — Other Ambulatory Visit: Payer: Self-pay

## 2020-09-08 ENCOUNTER — Encounter (HOSPITAL_BASED_OUTPATIENT_CLINIC_OR_DEPARTMENT_OTHER): Payer: Self-pay | Admitting: Obstetrics and Gynecology

## 2020-09-11 ENCOUNTER — Inpatient Hospital Stay (HOSPITAL_COMMUNITY): Admission: RE | Admit: 2020-09-11 | Payer: Medicare PPO | Source: Ambulatory Visit

## 2020-09-13 ENCOUNTER — Other Ambulatory Visit (HOSPITAL_COMMUNITY)
Admission: RE | Admit: 2020-09-13 | Discharge: 2020-09-13 | Disposition: A | Discharge status: Home / Self Care | Payer: Medicare PPO | Source: Ambulatory Visit | Attending: Obstetrics and Gynecology | Admitting: Obstetrics and Gynecology

## 2020-09-13 DIAGNOSIS — Z20822 Contact with and (suspected) exposure to covid-19: Secondary | ICD-10-CM | POA: Insufficient documentation

## 2020-09-13 DIAGNOSIS — Z01812 Encounter for preprocedural laboratory examination: Secondary | ICD-10-CM | POA: Diagnosis present

## 2020-09-13 LAB — SARS CORONAVIRUS 2 (TAT 6-24 HRS): SARS Coronavirus 2: NEGATIVE

## 2020-09-14 ENCOUNTER — Other Ambulatory Visit: Payer: Self-pay | Admitting: Obstetrics and Gynecology

## 2020-09-15 ENCOUNTER — Ambulatory Visit (HOSPITAL_BASED_OUTPATIENT_CLINIC_OR_DEPARTMENT_OTHER): Payer: Medicare PPO | Admitting: Anesthesiology

## 2020-09-15 ENCOUNTER — Other Ambulatory Visit: Payer: Self-pay | Admitting: Obstetrics and Gynecology

## 2020-09-15 ENCOUNTER — Encounter (HOSPITAL_BASED_OUTPATIENT_CLINIC_OR_DEPARTMENT_OTHER)
Admission: RE | Disposition: A | Discharge status: Home / Self Care | Payer: Self-pay | Source: Home / Self Care | Attending: Obstetrics and Gynecology

## 2020-09-15 ENCOUNTER — Encounter (HOSPITAL_BASED_OUTPATIENT_CLINIC_OR_DEPARTMENT_OTHER): Payer: Self-pay | Admitting: Obstetrics and Gynecology

## 2020-09-15 ENCOUNTER — Ambulatory Visit (HOSPITAL_BASED_OUTPATIENT_CLINIC_OR_DEPARTMENT_OTHER)
Admission: RE | Admit: 2020-09-15 | Discharge: 2020-09-15 | Disposition: A | Discharge status: Home / Self Care | Payer: Medicare PPO | Attending: Obstetrics and Gynecology | Admitting: Obstetrics and Gynecology

## 2020-09-15 ENCOUNTER — Other Ambulatory Visit: Payer: Self-pay

## 2020-09-15 DIAGNOSIS — Z85850 Personal history of malignant neoplasm of thyroid: Secondary | ICD-10-CM | POA: Insufficient documentation

## 2020-09-15 DIAGNOSIS — N840 Polyp of corpus uteri: Secondary | ICD-10-CM | POA: Diagnosis not present

## 2020-09-15 DIAGNOSIS — Z87891 Personal history of nicotine dependence: Secondary | ICD-10-CM | POA: Diagnosis not present

## 2020-09-15 DIAGNOSIS — Z79899 Other long term (current) drug therapy: Secondary | ICD-10-CM | POA: Insufficient documentation

## 2020-09-15 DIAGNOSIS — Z825 Family history of asthma and other chronic lower respiratory diseases: Secondary | ICD-10-CM | POA: Insufficient documentation

## 2020-09-15 DIAGNOSIS — E890 Postprocedural hypothyroidism: Secondary | ICD-10-CM | POA: Diagnosis not present

## 2020-09-15 DIAGNOSIS — E669 Obesity, unspecified: Secondary | ICD-10-CM | POA: Diagnosis not present

## 2020-09-15 DIAGNOSIS — N950 Postmenopausal bleeding: Secondary | ICD-10-CM | POA: Insufficient documentation

## 2020-09-15 DIAGNOSIS — Z79890 Hormone replacement therapy (postmenopausal): Secondary | ICD-10-CM | POA: Insufficient documentation

## 2020-09-15 DIAGNOSIS — Z6836 Body mass index (BMI) 36.0-36.9, adult: Secondary | ICD-10-CM | POA: Insufficient documentation

## 2020-09-15 DIAGNOSIS — Z8042 Family history of malignant neoplasm of prostate: Secondary | ICD-10-CM | POA: Diagnosis not present

## 2020-09-15 DIAGNOSIS — Z8349 Family history of other endocrine, nutritional and metabolic diseases: Secondary | ICD-10-CM | POA: Diagnosis not present

## 2020-09-15 DIAGNOSIS — Z791 Long term (current) use of non-steroidal anti-inflammatories (NSAID): Secondary | ICD-10-CM | POA: Insufficient documentation

## 2020-09-15 HISTORY — PX: DILATATION & CURETTAGE/HYSTEROSCOPY WITH MYOSURE: SHX6511

## 2020-09-15 SURGERY — DILATATION & CURETTAGE/HYSTEROSCOPY WITH MYOSURE
Anesthesia: General

## 2020-09-15 MED ORDER — MIDAZOLAM HCL 2 MG/2ML IJ SOLN
INTRAMUSCULAR | Status: AC
  Filled 2020-09-15: qty 2

## 2020-09-15 MED ORDER — LACTATED RINGERS IV SOLN: INTRAVENOUS | Status: DC

## 2020-09-15 MED ORDER — LIDOCAINE HCL (CARDIAC) PF 100 MG/5ML IV SOSY
PREFILLED_SYRINGE | INTRAVENOUS | Status: DC | PRN
  Administered 2020-09-15: 60 mg via INTRAVENOUS

## 2020-09-15 MED ORDER — FENTANYL CITRATE (PF) 100 MCG/2ML IJ SOLN
INTRAMUSCULAR | Status: DC | PRN
  Administered 2020-09-15 (×2): 50 ug via INTRAVENOUS

## 2020-09-15 MED ORDER — DROPERIDOL 2.5 MG/ML IJ SOLN
INTRAMUSCULAR | Status: DC | PRN
  Administered 2020-09-15: .625 mg via INTRAVENOUS

## 2020-09-15 MED ORDER — PROPOFOL 10 MG/ML IV BOLUS
INTRAVENOUS | Status: DC | PRN
  Administered 2020-09-15: 200 mg via INTRAVENOUS

## 2020-09-15 MED ORDER — ACETAMINOPHEN 500 MG PO TABS
ORAL_TABLET | ORAL | Status: AC
  Filled 2020-09-15: qty 2

## 2020-09-15 MED ORDER — SODIUM CHLORIDE 0.9 % IR SOLN
Status: DC | PRN
  Administered 2020-09-15: 3000 mL

## 2020-09-15 MED ORDER — FENTANYL CITRATE (PF) 100 MCG/2ML IJ SOLN
INTRAMUSCULAR | Status: AC
  Filled 2020-09-15: qty 2

## 2020-09-15 MED ORDER — DEXAMETHASONE SODIUM PHOSPHATE 4 MG/ML IJ SOLN
INTRAMUSCULAR | Status: DC | PRN
  Administered 2020-09-15: 10 mg via INTRAVENOUS

## 2020-09-15 MED ORDER — POVIDONE-IODINE 10 % EX SWAB
2.0000 "application " | Freq: Once | CUTANEOUS | Status: AC
  Administered 2020-09-15: 2 via TOPICAL

## 2020-09-15 MED ORDER — MIDAZOLAM HCL 5 MG/5ML IJ SOLN
INTRAMUSCULAR | Status: DC | PRN
  Administered 2020-09-15: 2 mg via INTRAVENOUS

## 2020-09-15 MED ORDER — LIDOCAINE HCL (PF) 1 % IJ SOLN
INTRAMUSCULAR | Status: AC
  Filled 2020-09-15: qty 30

## 2020-09-15 MED ORDER — ACETAMINOPHEN 500 MG PO TABS
1000.0000 mg | ORAL_TABLET | ORAL | Status: AC
  Administered 2020-09-15: 1000 mg via ORAL

## 2020-09-15 MED ORDER — SILVER NITRATE-POT NITRATE 75-25 % EX MISC
CUTANEOUS | Status: AC
  Filled 2020-09-15: qty 10

## 2020-09-15 MED ORDER — IBUPROFEN 800 MG PO TABS: 800.0000 mg | ORAL_TABLET | Freq: Three times a day (TID) | ORAL | 0 refills | Status: DC | PRN

## 2020-09-15 MED ORDER — FENTANYL CITRATE (PF) 100 MCG/2ML IJ SOLN: 25.0000 ug | INTRAMUSCULAR | Status: DC | PRN

## 2020-09-15 MED ORDER — ONDANSETRON HCL 4 MG/2ML IJ SOLN
4.0000 mg | Freq: Once | INTRAMUSCULAR | Status: AC | PRN
  Administered 2020-09-15: 13:00:00 4 mg via INTRAVENOUS

## 2020-09-15 SURGICAL SUPPLY — 16 items
CATH ROBINSON RED A/P 16FR (CATHETERS) ×2 IMPLANT
DEVICE MYOSURE LITE (MISCELLANEOUS) ×1 IMPLANT
ELECT REM PT RETURN 9FT ADLT (ELECTROSURGICAL) ×2
ELECTRODE REM PT RTRN 9FT ADLT (ELECTROSURGICAL) ×1 IMPLANT
GAUZE 4X4 16PLY RFD (DISPOSABLE) ×2 IMPLANT
GLOVE BIOGEL M 6.5 STRL (GLOVE) ×2 IMPLANT
GLOVE BIOGEL PI IND STRL 7.0 (GLOVE) ×1 IMPLANT
GLOVE BIOGEL PI INDICATOR 7.0 (GLOVE) ×1
GLOVE SURG UNDER POLY LF SZ6.5 (GLOVE) ×2 IMPLANT
GOWN STRL REUS W/TWL LRG LVL3 (GOWN DISPOSABLE) ×4 IMPLANT
KIT PROCEDURE FLUENT (KITS) ×2 IMPLANT
PACK VAGINAL MINOR WOMEN LF (CUSTOM PROCEDURE TRAY) ×2 IMPLANT
PAD OB MATERNITY 4.3X12.25 (PERSONAL CARE ITEMS) ×2 IMPLANT
PAD PREP 24X48 CUFFED NSTRL (MISCELLANEOUS) ×2 IMPLANT
SLEEVE SCD COMPRESS KNEE MED (MISCELLANEOUS) ×2 IMPLANT
TOWEL GREEN STERILE FF (TOWEL DISPOSABLE) ×4 IMPLANT

## 2020-09-15 NOTE — Anesthesia Postprocedure Evaluation (Signed)
Anesthesia Post Note  Patient: Peggy Wagner  Procedure(s) Performed: DILATATION AND CURETTAGE /HYSTEROSCOPY WITH MYOSURE (N/A )     Patient location during evaluation: PACU Anesthesia Type: General Level of consciousness: awake and alert Pain management: pain level controlled Vital Signs Assessment: post-procedure vital signs reviewed and stable Respiratory status: spontaneous breathing, nonlabored ventilation and respiratory function stable Cardiovascular status: blood pressure returned to baseline and stable Postop Assessment: no apparent nausea or vomiting Anesthetic complications: no   No complications documented.  Last Vitals:  Vitals:   09/15/20 1415 09/15/20 1430  BP: 105/67 104/72  Pulse: 75 81  Resp: 12 13  Temp:    SpO2: 97% 97%    Last Pain:  Vitals:   09/15/20 1430  TempSrc:   PainSc: 0-No pain                 Lynda Rainwater

## 2020-09-15 NOTE — Anesthesia Preprocedure Evaluation (Signed)
Anesthesia Evaluation  Patient identified by MRN, date of birth, ID band Patient awake    Reviewed: Allergy & Precautions, NPO status , Patient's Chart, lab work & pertinent test results  Airway Mallampati: II  TM Distance: >3 FB Neck ROM: Full    Dental no notable dental hx. (+) Teeth Intact   Pulmonary sleep apnea ,    Pulmonary exam normal breath sounds clear to auscultation       Cardiovascular negative cardio ROS Normal cardiovascular exam Rhythm:Regular Rate:Normal     Neuro/Psych negative neurological ROS  negative psych ROS   GI/Hepatic negative GI ROS, Neg liver ROS,   Endo/Other  Hypothyroidism Obesity Hx/o papillary thyroid Ca S/P Thyroidectomy  Renal/GU negative Renal ROS  negative genitourinary   Musculoskeletal negative musculoskeletal ROS (+)   Abdominal (+) + obese,   Peds  Hematology negative hematology ROS (+)   Anesthesia Other Findings   Reproductive/Obstetrics Post menopausal bleeding                             Anesthesia Physical Anesthesia Plan  ASA: II  Anesthesia Plan: General   Post-op Pain Management:    Induction: Intravenous  PONV Risk Score and Plan: 4 or greater and Treatment may vary due to age or medical condition and Ondansetron  Airway Management Planned: LMA  Additional Equipment:   Intra-op Plan:   Post-operative Plan: Extubation in OR  Informed Consent: I have reviewed the patients History and Physical, chart, labs and discussed the procedure including the risks, benefits and alternatives for the proposed anesthesia with the patient or authorized representative who has indicated his/her understanding and acceptance.     Dental advisory given  Plan Discussed with: CRNA and Anesthesiologist  Anesthesia Plan Comments:         Anesthesia Quick Evaluation

## 2020-09-15 NOTE — Op Note (Signed)
09/15/2020  1:49 PM  PATIENT:  Peggy Wagner  64 y.o. female  PRE-OPERATIVE DIAGNOSIS:  Postmenopausal Bleeding N95.0  POST-OPERATIVE DIAGNOSIS:  Postmenopausal Bleeding N95.0  PROCEDURE:  Procedure(s): DILATATION AND CURETTAGE /HYSTEROSCOPY WITH MYOSURE (N/A)  SURGEON:  Surgeon(s) and Role:    Christophe Louis, MD - Primary  PHYSICIAN ASSISTANT: None  ASSISTANTS: none   ANESTHESIA:   general  EBL:  5 mL   BLOOD ADMINISTERED:none  DRAINS: none   LOCAL MEDICATIONS USED:  MARCAINE     SPECIMEN:  Source of Specimen:  Endometrial currettings   DISPOSITION OF SPECIMEN:  PATHOLOGY  COUNTS:  YES  TOURNIQUET:  * No tourniquets in log *  DICTATION: .Dragon Dictation  PLAN OF CARE: Discharge to home after PACU  PATIENT DISPOSITION:  PACU - hemodynamically stable.   Delay start of Pharmacological VTE agent (>24 hrs) due to surgical blood loss or risk of bleeding: not applicable  Findings: normal external genitalia ... slightly atrophic vaginal mucosa. .. atrophic appearing cervix. Atrophic endometrial lining. No masses are seen in the endometrial cavity.   Procedure: Patient was taken to the operating room #7 at the [facility] where she was placed under general anesthesia. She was placed in the dorsal lithotomy position. She was prepped and draped in the usual sterile fashion.  A time out was performed. A speculum was placed into the vaginal vault. The anterior lip of the cervix was grasped with a single-tooth tenaculum. Quarter percent Marcaine was injected at the 4 and 8:00 positions of the cervix. The cervix was then sounded to 7 cm. The cervix was dilated to approximately 6 mm. Mysosure operative  hysteroscope was inserted. The findings noted above. Mysosure lite   blade was introduced through the hysteroscope. Curetting's were obtained with the lite blade. There was no sign of uterine  perforation. .The hysteroscope was removed.  The single-tooth tenaculum was  removed from the anterior lip of the cervix. Excellent hemostasis was noted. The speculum was removed from the patient's vagina. She was awakened from anesthesia taken care  To the recovery  room awake and in stable condition. Sponge lap and needle counts were correct x 2.

## 2020-09-15 NOTE — Discharge Instructions (Signed)
  No Tylenol until 4:43 pm  Post Anesthesia Home Care Instructions  Activity: Get plenty of rest for the remainder of the day. A responsible individual must stay with you for 24 hours following the procedure.  For the next 24 hours, DO NOT: -Drive a car -Paediatric nurse -Drink alcoholic beverages -Take any medication unless instructed by your physician -Make any legal decisions or sign important papers.  Meals: Start with liquid foods such as gelatin or soup. Progress to regular foods as tolerated. Avoid greasy, spicy, heavy foods. If nausea and/or vomiting occur, drink only clear liquids until the nausea and/or vomiting subsides. Call your physician if vomiting continues.  Special Instructions/Symptoms: Your throat may feel dry or sore from the anesthesia or the breathing tube placed in your throat during surgery. If this causes discomfort, gargle with warm salt water. The discomfort should disappear within 24 hours.  If you had a scopolamine patch placed behind your ear for the management of post- operative nausea and/or vomiting:  1. The medication in the patch is effective for 72 hours, after which it should be removed.  Wrap patch in a tissue and discard in the trash. Wash hands thoroughly with soap and water. 2. You may remove the patch earlier than 72 hours if you experience unpleasant side effects which may include dry mouth, dizziness or visual disturbances. 3. Avoid touching the patch. Wash your hands with soap and water after contact with the patch.

## 2020-09-15 NOTE — H&P (Signed)
Date of Initial H&P: 09/15/2020  History reviewed, patient examined, no change in status, stable for surgery.

## 2020-09-15 NOTE — Transfer of Care (Signed)
Immediate Anesthesia Transfer of Care Note  Patient: Peggy Wagner  Procedure(s) Performed: DILATATION AND CURETTAGE /HYSTEROSCOPY WITH MYOSURE (N/A )  Patient Location: PACU  Anesthesia Type:General  Level of Consciousness: awake, alert , oriented, drowsy and patient cooperative  Airway & Oxygen Therapy: Patient Spontanous Breathing and Patient connected to face mask oxygen  Post-op Assessment: Report given to RN and Post -op Vital signs reviewed and stable  Post vital signs: Reviewed and stable  Last Vitals:  Vitals Value Taken Time  BP    Temp    Pulse    Resp    SpO2      Last Pain:  Vitals:   09/15/20 1038  TempSrc: Oral  PainSc: 0-No pain         Complications: No complications documented.

## 2020-09-15 NOTE — H&P (Deleted)
  The note originally documented on this encounter has been moved the the encounter in which it belongs.  

## 2020-09-15 NOTE — H&P (Signed)
Subjective:    Chief Complaint(s):      preop- postmenopausal bleeding       HPI:          Isolation Precautions          Has patient received COVID-19 vaccination?  Yes- Moderna.  Does patient report new onset of COVID symptoms?  No.  Has patient or close contact tested positive for COVID-19?  No , not in the past 2 weeks.         General          64 yo presents for pre-op visit.            Pt is scheduled for hysteroscopy/D&C w/ possible polypectomy on Sep 15, 2020 to rule out hyperplasia or malignancy.            U/S performed at [facility] on Jan 16, 2020 revealed uterus measuring 6.5 cm x 3.0 cm x 3.8 cm. Endometrium measured 4 mm. RT OV contained simple cyst measuring 1.7 cm. LT OV not visualized.            Pt contacted office on March 30, 2020 c/o resumed bleeding on June 28th and spotting on July 2nd. At visit on April 19, 2020, she reported bleeding again on July 13th.            EMB attempted on April 19, 2020, however was unsuccessful.            Today, pt reports she had some minor bleeding that started again this morning. Prior to today, she had some intermittent bleeding, on and off, but not every day.            Pt declined blood transfusion if needed, given she is Jehovah Witness.            Pelvic exam slightly atrophic vaginal mucosa.     Current Medication:      Taking   Rosuvastatin Calcium 10 MG Tablet TAKE ONE TABLET AT BEDTIME.      Meloxicam 15 MG Tablet 1 tablet Orally Once a day.      Methocarbamol 750 MG Tablet 1 tablet Orally every 8 hrs prn.      Synthroid(L-Thyroxine Sodium) 100 MCG Tablet 1 tablet in the morning on an empty stomach Orally Once a day.      Calcitriol 0.25 MCG Capsule 1 capsule Orally every other day.      Centrum Silver 50+Women - Tablet Orally.      Vitamin D 1000 UNIT Tablet 1 tablet Orally Once a day.      Medication List reviewed and reconciled with the patient.      Medical History:   hypercholesterolemia       Hypothyroidism      Papillary thyroid carcinoma, thyroidectomy and radioactive iodine therapy Dr Buddy Duty      overweight      obstructive sleep apnea (03/08/11 ESS 23, AHI 29/hr REM 75/hr, RDI 32/hr REM 75/hr, O2 min 75%; 03/15/11 CPAP 13)       Allergies/Intolerance:      Penicillin (for allergy): Allergy - hives      Codeine (for allergy): Allergy - migraines      Erythromycin: Allergy - nausea    Gyn History:   Sexual activity not currently sexually active.   Periods : postmenopausal.   LMP no bleeding in the last few weeks.   Last pap smear date 01/13/2020-negative.   Last mammogram date 02/26/2017?[facility]  OB History:   Number of pregnancies  1.   Pregnancy # 1  abortion.        Surgical History:   thyroidectomy 2008      menisectomy left      colonoscopy 2009,2020      oral surgery 11/2018      hysteroscopy D&C 2010       Hospitalization:   surgery       Family History:   Father: deceased, cancer prostate, diagnosed with Prostate CA    Mother: deceased, hemorrhage, s/p complication of abortion 1969    Sister 1: alive, asthma    Sister 2: alive, hypothyroidism    2 sister(s) .          No Family History of Colon Cancer, Polyps, or Liver Disease.     Social History:       General         Tobacco use cigarettes:  Former smoker, Tobacco history last updated  09/02/2020, Vaping  No.           no Alcohol.           Caffeine: yes, occasionally.           no Recreational drug use.           Marital Status: single.           OCCUPATION: retired Pharmacist, hospital.      ROS:       CONSTITUTIONAL         Chills  No.  Fatigue  No.  Fever  No.  Night sweats  No.  Recent travel outside [place]  No.  Sweats  No.  Weight change  No.         OPHTHALMOLOGY         Blurring of vision  no.  Change in vision  no.  Double vision  no.         ENT         Dizziness  no.  Nose bleeds  no.  Sore throat  no.  Teeth pain  no.         ALLERGY         Hives  no.         CARDIOLOGY         Chest  pain  no.  High blood pressure  no.  Irregular heart beat  no.  Leg edema  no.  Palpitations  no.         RESPIRATORY         Shortness of breath  no.  Cough  no.  Wheezing  no.         UROLOGY         Pain with urination  no.  Urinary urgency  no.  Urinary frequency  no.  Urinary incontinence  no.  Difficulty urinating  No.  Blood in urine  No.         GASTROENTEROLOGY         Abdominal pain  no.  Appetite change  no.  Bloating/belching  no.  Blood in stool or on toilet paper  no.  Change in bowel movements  no.  Constipation  no.  Diarrhea  no.  Difficulty swallowing  no.  Nausea  no.         FEMALE REPRODUCTIVE         Vulvar pain  no.  Vulvar rash  no.  Abnormal vaginal bleeding  YES.  Breast pain  no.  Nipple discharge  no.  Pain with intercourse  no.  Pelvic pain  no.  Unusual vaginal discharge  no.  Vaginal itching  no.         MUSCULOSKELETAL         Muscle aches  no.         NEUROLOGY         Headache  no.  Tingling/numbness  no.  Weakness  no.         PSYCHOLOGY         Depression  no.  Anxiety  no.  Nervousness  no.  Sleep disturbances  no.  Suicidal ideation  no .         ENDOCRINOLOGY         Excessive thirst  no.  Excessive urination  no.  Hair loss  no.  Heat or cold intolerance  no.         HEMATOLOGY/LYMPH         Abnormal bleeding  no.  Easy bruising  no.  Swollen glands  no.         DERMATOLOGY         New/changing skin lesion  no.  Rash  no.  Sores  no.            Negative except as stated in HPI.   Objective:    Vitals:        Wt 214.8, Wt change -7.8 lb, Ht 63.5, BMI 37.45, Pulse sitting 70, BP sitting 132/86.     Past Results:    Examination:          General Examination         CONSTITUTIONAL: alert, oriented, NAD .          SKIN: moist, warm.          EYES: Conjunctiva clear.          LUNGS: good I:E efffort noted, CTA bilat.          HEART: RRR.          ABDOMEN: soft, non-tender/non-distended, bowel sounds present .          FEMALE  GENITOURINARY: normal external genitalia, labia - unremarkable, vagina - no lesions or abnormal discharge, slightly atrophic vagina, cervix - no discharge or lesions or CMT, adnexa - no masses or tenderness, uterus - nontender and normal size on palpation.          PSYCH: affect normal, good eye contact.      Physical Examination:         Pt aware of scribe services today.    Assessment:     Assessment:    Postmenopausal bleeding - N95.0 (Primary)        Plan:    Treatment:      Postmenopausal bleeding          Notes: Pt is scheduled for hysteroscopy/D&C w/ possible polypectomy on Sep 15, 2020 to rule out hyperplasia or malignancy. Pt is advised she will be able to return home the same day. Discussed risks of hysteroscopy including but not limited to infection, bleeding, possible perforation of the uterus, with the need for further surgery. Discussed risk of blood transfusion and risk of HIV or hep B&C (1 out of 2 million and 1 out of 200,000, respectively) with blood transfusion. Pt is aware of risks, however she declined blood transfusion given she is Jehovah Witness. Pt advised to avoid NSAIDs (Aspirin, Aleve, Advil, Ibuprofen, Motrin) from  now until surgery given risk of bleeding during surgery. She may take Tylenol for pain management. She is advised to avoid eating or drinking starting midnight prior to surgery. Pt is advised that she may have watery discharge or cramping after surgery. Discussed post-surgery avoidance of driving for 24 hours and avoidance of lifting weight greater than 10 lbs or intercourse for 2 weeks after procedure. Provided pt w/ 2 Cytotec tablets to use vaginally the night prior to her surgery and morning of surgery. Follow up in 4 weeks for 2 wk post-op visit.  Subjective:    Chief Complaint(s):      preop- postmenopausal bleeding       HPI:          Isolation Precautions          Has patient received COVID-19 vaccination?  Yes- Moderna.  Does patient  report new onset of COVID symptoms?  No.  Has patient or close contact tested positive for COVID-19?  No , not in the past 2 weeks.         General          64 yo presents for pre-op visit.            Pt is scheduled for hysteroscopy/D&C w/ possible polypectomy on Sep 15, 2020 to rule out hyperplasia or malignancy.            U/S performed at [facility] on Jan 16, 2020 revealed uterus measuring 6.5 cm x 3.0 cm x 3.8 cm. Endometrium measured 4 mm. RT OV contained simple cyst measuring 1.7 cm. LT OV not visualized.            Pt contacted office on March 30, 2020 c/o resumed bleeding on June 28th and spotting on July 2nd. At visit on April 19, 2020, she reported bleeding again on July 13th.            EMB attempted on April 19, 2020, however was unsuccessful.            Today, pt reports she had some minor bleeding that started again this morning. Prior to today, she had some intermittent bleeding, on and off, but not every day.            Pt declined blood transfusion if needed, given she is Jehovah Witness.            Pelvic exam slightly atrophic vaginal mucosa.     Current Medication:      Taking   Rosuvastatin Calcium 10 MG Tablet TAKE ONE TABLET AT BEDTIME.      Meloxicam 15 MG Tablet 1 tablet Orally Once a day.      Methocarbamol 750 MG Tablet 1 tablet Orally every 8 hrs prn.      Synthroid(L-Thyroxine Sodium) 100 MCG Tablet 1 tablet in the morning on an empty stomach Orally Once a day.      Calcitriol 0.25 MCG Capsule 1 capsule Orally every other day.      Centrum Silver 50+Women - Tablet Orally.      Vitamin D 1000 UNIT Tablet 1 tablet Orally Once a day.      Medication List reviewed and reconciled with the patient.      Medical History:   hypercholesterolemia      Hypothyroidism      Papillary thyroid carcinoma, thyroidectomy and radioactive iodine therapy Dr Buddy Duty      overweight      obstructive sleep apnea (03/08/11 ESS 23, AHI 29/hr REM  75/hr, RDI 32/hr REM  75/hr, O2 min 75%; 03/15/11 CPAP 13)       Allergies/Intolerance:      Penicillin (for allergy): Allergy - hives      Codeine (for allergy): Allergy - migraines      Erythromycin: Allergy - nausea    Gyn History:   Sexual activity not currently sexually active.   Periods : postmenopausal.   LMP no bleeding in the last few weeks.   Last pap smear date 01/13/2020-negative.   Last mammogram date 02/26/2017?.        OB History:   Number of pregnancies  1.   Pregnancy # 1  abortion.        Surgical History:   thyroidectomy 2008      menisectomy left      colonoscopy 2009,2020      oral surgery 11/2018      hysteroscopy D&C 2010       Hospitalization:   surgery       Family History:   Father: deceased, cancer prostate, diagnosed with Prostate CA    Mother: deceased, hemorrhage, s/p complication of abortion 1969    Sister 1: alive, asthma    Sister 2: alive, hypothyroidism    2 sister(s) .          No Family History of Colon Cancer, Polyps, or Liver Disease.     Social History:       General         Tobacco use cigarettes:  Former smoker, Tobacco history last updated  09/02/2020, Vaping  No.           no Alcohol.           Caffeine: yes, occasionally.           no Recreational drug use.           Marital Status: single.           OCCUPATION: retired Pharmacist, hospital.      ROS:       CONSTITUTIONAL         Chills  No.  Fatigue  No.  Fever  No.  Night sweats  No.  Recent travel outside [place]  No.  Sweats  No.  Weight change  No.         OPHTHALMOLOGY         Blurring of vision  no.  Change in vision  no.  Double vision  no.         ENT         Dizziness  no.  Nose bleeds  no.  Sore throat  no.  Teeth pain  no.         ALLERGY         Hives  no.         CARDIOLOGY         Chest pain  no.  High blood pressure  no.  Irregular heart beat  no.  Leg edema  no.  Palpitations  no.         RESPIRATORY         Shortness of breath  no.  Cough  no.  Wheezing  no.         UROLOGY          Pain with urination  no.  Urinary urgency  no.  Urinary frequency  no.  Urinary incontinence  no.  Difficulty urinating  No.  Blood in urine  No.  GASTROENTEROLOGY         Abdominal pain  no.  Appetite change  no.  Bloating/belching  no.  Blood in stool or on toilet paper  no.  Change in bowel movements  no.  Constipation  no.  Diarrhea  no.  Difficulty swallowing  no.  Nausea  no.         FEMALE REPRODUCTIVE         Vulvar pain  no.  Vulvar rash  no.  Abnormal vaginal bleeding  YES.  Breast pain  no.  Nipple discharge  no.  Pain with intercourse  no.  Pelvic pain  no.  Unusual vaginal discharge  no.  Vaginal itching  no.         MUSCULOSKELETAL         Muscle aches  no.         NEUROLOGY         Headache  no.  Tingling/numbness  no.  Weakness  no.         PSYCHOLOGY         Depression  no.  Anxiety  no.  Nervousness  no.  Sleep disturbances  no.  Suicidal ideation  no .         ENDOCRINOLOGY         Excessive thirst  no.  Excessive urination  no.  Hair loss  no.  Heat or cold intolerance  no.         HEMATOLOGY/LYMPH         Abnormal bleeding  no.  Easy bruising  no.  Swollen glands  no.         DERMATOLOGY         New/changing skin lesion  no.  Rash  no.  Sores  no.            Negative except as stated in HPI.   Objective:    Vitals:        Wt 214.8, Wt change -7.8 lb, Ht 63.5, BMI 37.45, Pulse sitting 70, BP sitting 132/86.     Past Results:    Examination:          General Examination         CONSTITUTIONAL: alert, oriented, NAD .          SKIN: moist, warm.          EYES: Conjunctiva clear.          LUNGS: good I:E efffort noted, CTA bilat.          HEART: RRR.          ABDOMEN: soft, non-tender/non-distended, bowel sounds present .          FEMALE GENITOURINARY: normal external genitalia, labia - unremarkable, vagina - no lesions or abnormal discharge, slightly atrophic vagina, cervix - no discharge or lesions or CMT, adnexa - no masses or tenderness, uterus  - nontender and normal size on palpation.          PSYCH: affect normal, good eye contact.      Physical Examination:         Pt aware of scribe services today.    Assessment:     Assessment:    Postmenopausal bleeding - N95.0 (Primary)        Plan:    Treatment:      Postmenopausal bleeding          Notes: Pt is scheduled for hysteroscopy/D&C w/ possible polypectomy on Sep 15, 2020  to rule out hyperplasia or malignancy. Pt is advised she will be able to return home the same day. Discussed risks of hysteroscopy including but not limited to infection, bleeding, possible perforation of the uterus, with the need for further surgery. Discussed risk of blood transfusion and risk of HIV or hep B&C (1 out of 2 million and 1 out of 200,000, respectively) with blood transfusion. Pt is aware of risks, however she declined blood transfusion given she is Jehovah Witness. Pt advised to avoid NSAIDs (Aspirin, Aleve, Advil, Ibuprofen, Motrin) from now until surgery given risk of bleeding during surgery. She may take Tylenol for pain management. She is advised to avoid eating or drinking starting midnight prior to surgery. Pt is advised that she may have watery discharge or cramping after surgery. Discussed post-surgery avoidance of driving for 24 hours and avoidance of lifting weight greater than 10 lbs or intercourse for 2 weeks after procedure. Provided pt w/ 2 Cytotec tablets to use vaginally the night prior to her surgery and morning of surgery. Follow up in 4 weeks for 2 wk post-op visit.

## 2020-09-15 NOTE — Anesthesia Procedure Notes (Signed)
Procedure Name: LMA Insertion Date/Time: 09/15/2020 1:13 PM Performed by: Willa Frater, CRNA Pre-anesthesia Checklist: Patient identified, Emergency Drugs available, Suction available and Patient being monitored Patient Re-evaluated:Patient Re-evaluated prior to induction Oxygen Delivery Method: Circle system utilized Preoxygenation: Pre-oxygenation with 100% oxygen Induction Type: IV induction Ventilation: Mask ventilation without difficulty LMA: LMA inserted LMA Size: 4.0 Number of attempts: 1 Airway Equipment and Method: Bite block Placement Confirmation: positive ETCO2 Tube secured with: Tape Dental Injury: Teeth and Oropharynx as per pre-operative assessment

## 2020-09-16 ENCOUNTER — Encounter (HOSPITAL_BASED_OUTPATIENT_CLINIC_OR_DEPARTMENT_OTHER): Payer: Self-pay | Admitting: Obstetrics and Gynecology

## 2020-09-16 LAB — SURGICAL PATHOLOGY

## 2020-09-28 ENCOUNTER — Encounter (HOSPITAL_BASED_OUTPATIENT_CLINIC_OR_DEPARTMENT_OTHER): Payer: Self-pay | Admitting: Obstetrics and Gynecology

## 2020-10-28 DIAGNOSIS — G4733 Obstructive sleep apnea (adult) (pediatric): Secondary | ICD-10-CM | POA: Diagnosis not present

## 2020-11-25 DIAGNOSIS — G4733 Obstructive sleep apnea (adult) (pediatric): Secondary | ICD-10-CM | POA: Diagnosis not present

## 2020-11-29 DIAGNOSIS — G4733 Obstructive sleep apnea (adult) (pediatric): Secondary | ICD-10-CM | POA: Diagnosis not present

## 2020-12-26 DIAGNOSIS — G4733 Obstructive sleep apnea (adult) (pediatric): Secondary | ICD-10-CM | POA: Diagnosis not present

## 2020-12-29 DIAGNOSIS — G4733 Obstructive sleep apnea (adult) (pediatric): Secondary | ICD-10-CM | POA: Diagnosis not present

## 2021-01-03 DIAGNOSIS — M21962 Unspecified acquired deformity of left lower leg: Secondary | ICD-10-CM | POA: Diagnosis not present

## 2021-01-03 DIAGNOSIS — M792 Neuralgia and neuritis, unspecified: Secondary | ICD-10-CM | POA: Diagnosis not present

## 2021-01-03 DIAGNOSIS — M21961 Unspecified acquired deformity of right lower leg: Secondary | ICD-10-CM | POA: Diagnosis not present

## 2021-01-25 DIAGNOSIS — G4733 Obstructive sleep apnea (adult) (pediatric): Secondary | ICD-10-CM | POA: Diagnosis not present

## 2021-01-31 DIAGNOSIS — Z85850 Personal history of malignant neoplasm of thyroid: Secondary | ICD-10-CM | POA: Diagnosis not present

## 2021-01-31 DIAGNOSIS — E892 Postprocedural hypoparathyroidism: Secondary | ICD-10-CM | POA: Diagnosis not present

## 2021-01-31 DIAGNOSIS — E890 Postprocedural hypothyroidism: Secondary | ICD-10-CM | POA: Diagnosis not present

## 2021-02-14 DIAGNOSIS — M2550 Pain in unspecified joint: Secondary | ICD-10-CM | POA: Diagnosis not present

## 2021-02-14 DIAGNOSIS — R5383 Other fatigue: Secondary | ICD-10-CM | POA: Diagnosis not present

## 2021-02-14 DIAGNOSIS — E892 Postprocedural hypoparathyroidism: Secondary | ICD-10-CM | POA: Diagnosis not present

## 2021-02-14 DIAGNOSIS — E890 Postprocedural hypothyroidism: Secondary | ICD-10-CM | POA: Diagnosis not present

## 2021-02-14 DIAGNOSIS — Z85850 Personal history of malignant neoplasm of thyroid: Secondary | ICD-10-CM | POA: Diagnosis not present

## 2021-02-14 DIAGNOSIS — L659 Nonscarring hair loss, unspecified: Secondary | ICD-10-CM | POA: Diagnosis not present

## 2021-02-14 DIAGNOSIS — M79669 Pain in unspecified lower leg: Secondary | ICD-10-CM | POA: Diagnosis not present

## 2021-02-14 DIAGNOSIS — F329 Major depressive disorder, single episode, unspecified: Secondary | ICD-10-CM | POA: Diagnosis not present

## 2021-02-25 DIAGNOSIS — G4733 Obstructive sleep apnea (adult) (pediatric): Secondary | ICD-10-CM | POA: Diagnosis not present

## 2021-03-21 DIAGNOSIS — Z8619 Personal history of other infectious and parasitic diseases: Secondary | ICD-10-CM | POA: Diagnosis not present

## 2021-03-21 DIAGNOSIS — Z9189 Other specified personal risk factors, not elsewhere classified: Secondary | ICD-10-CM | POA: Diagnosis not present

## 2021-03-21 DIAGNOSIS — Z7251 High risk heterosexual behavior: Secondary | ICD-10-CM | POA: Diagnosis not present

## 2021-03-27 DIAGNOSIS — G4733 Obstructive sleep apnea (adult) (pediatric): Secondary | ICD-10-CM | POA: Diagnosis not present

## 2021-03-29 DIAGNOSIS — G4733 Obstructive sleep apnea (adult) (pediatric): Secondary | ICD-10-CM | POA: Diagnosis not present

## 2021-04-19 ENCOUNTER — Other Ambulatory Visit: Payer: Self-pay | Admitting: Internal Medicine

## 2021-04-19 ENCOUNTER — Ambulatory Visit
Admission: RE | Admit: 2021-04-19 | Discharge: 2021-04-19 | Disposition: A | Discharge status: Home / Self Care | Payer: Medicare PPO | Source: Ambulatory Visit | Attending: Internal Medicine | Admitting: Internal Medicine

## 2021-04-19 DIAGNOSIS — E890 Postprocedural hypothyroidism: Secondary | ICD-10-CM | POA: Diagnosis not present

## 2021-04-19 DIAGNOSIS — E559 Vitamin D deficiency, unspecified: Secondary | ICD-10-CM | POA: Diagnosis not present

## 2021-04-19 DIAGNOSIS — M5412 Radiculopathy, cervical region: Secondary | ICD-10-CM

## 2021-04-19 DIAGNOSIS — E7800 Pure hypercholesterolemia, unspecified: Secondary | ICD-10-CM | POA: Diagnosis not present

## 2021-04-19 DIAGNOSIS — Z0000 Encounter for general adult medical examination without abnormal findings: Secondary | ICD-10-CM | POA: Diagnosis not present

## 2021-04-19 DIAGNOSIS — Z5181 Encounter for therapeutic drug level monitoring: Secondary | ICD-10-CM | POA: Diagnosis not present

## 2021-04-19 DIAGNOSIS — Z85850 Personal history of malignant neoplasm of thyroid: Secondary | ICD-10-CM | POA: Diagnosis not present

## 2021-04-19 DIAGNOSIS — Z79899 Other long term (current) drug therapy: Secondary | ICD-10-CM | POA: Diagnosis not present

## 2021-04-19 DIAGNOSIS — M4722 Other spondylosis with radiculopathy, cervical region: Secondary | ICD-10-CM | POA: Diagnosis not present

## 2021-04-19 DIAGNOSIS — H9192 Unspecified hearing loss, left ear: Secondary | ICD-10-CM | POA: Diagnosis not present

## 2021-04-19 DIAGNOSIS — G4733 Obstructive sleep apnea (adult) (pediatric): Secondary | ICD-10-CM | POA: Diagnosis not present

## 2021-04-27 DIAGNOSIS — G4733 Obstructive sleep apnea (adult) (pediatric): Secondary | ICD-10-CM | POA: Diagnosis not present

## 2021-05-03 DIAGNOSIS — H9042 Sensorineural hearing loss, unilateral, left ear, with unrestricted hearing on the contralateral side: Secondary | ICD-10-CM | POA: Diagnosis not present

## 2021-05-20 DIAGNOSIS — H9042 Sensorineural hearing loss, unilateral, left ear, with unrestricted hearing on the contralateral side: Secondary | ICD-10-CM | POA: Diagnosis not present

## 2021-05-23 DIAGNOSIS — G4733 Obstructive sleep apnea (adult) (pediatric): Secondary | ICD-10-CM | POA: Diagnosis not present

## 2021-05-28 DIAGNOSIS — G4733 Obstructive sleep apnea (adult) (pediatric): Secondary | ICD-10-CM | POA: Diagnosis not present

## 2021-05-31 DIAGNOSIS — M542 Cervicalgia: Secondary | ICD-10-CM | POA: Diagnosis not present

## 2021-06-13 DIAGNOSIS — M542 Cervicalgia: Secondary | ICD-10-CM | POA: Diagnosis not present

## 2021-06-20 DIAGNOSIS — M5412 Radiculopathy, cervical region: Secondary | ICD-10-CM | POA: Diagnosis not present

## 2021-06-27 DIAGNOSIS — M5412 Radiculopathy, cervical region: Secondary | ICD-10-CM | POA: Diagnosis not present

## 2021-06-27 DIAGNOSIS — G4733 Obstructive sleep apnea (adult) (pediatric): Secondary | ICD-10-CM | POA: Diagnosis not present

## 2021-07-04 DIAGNOSIS — M21962 Unspecified acquired deformity of left lower leg: Secondary | ICD-10-CM | POA: Diagnosis not present

## 2021-07-04 DIAGNOSIS — L84 Corns and callosities: Secondary | ICD-10-CM | POA: Diagnosis not present

## 2021-07-04 DIAGNOSIS — B353 Tinea pedis: Secondary | ICD-10-CM | POA: Diagnosis not present

## 2021-07-04 DIAGNOSIS — M5412 Radiculopathy, cervical region: Secondary | ICD-10-CM | POA: Diagnosis not present

## 2021-07-04 DIAGNOSIS — L602 Onychogryphosis: Secondary | ICD-10-CM | POA: Diagnosis not present

## 2021-07-04 DIAGNOSIS — M792 Neuralgia and neuritis, unspecified: Secondary | ICD-10-CM | POA: Diagnosis not present

## 2021-07-04 DIAGNOSIS — M21961 Unspecified acquired deformity of right lower leg: Secondary | ICD-10-CM | POA: Diagnosis not present

## 2021-07-15 DIAGNOSIS — M9902 Segmental and somatic dysfunction of thoracic region: Secondary | ICD-10-CM | POA: Diagnosis not present

## 2021-07-15 DIAGNOSIS — M9901 Segmental and somatic dysfunction of cervical region: Secondary | ICD-10-CM | POA: Diagnosis not present

## 2021-07-15 DIAGNOSIS — M5032 Other cervical disc degeneration, mid-cervical region, unspecified level: Secondary | ICD-10-CM | POA: Diagnosis not present

## 2021-07-15 DIAGNOSIS — M5414 Radiculopathy, thoracic region: Secondary | ICD-10-CM | POA: Diagnosis not present

## 2021-07-18 DIAGNOSIS — M9902 Segmental and somatic dysfunction of thoracic region: Secondary | ICD-10-CM | POA: Diagnosis not present

## 2021-07-18 DIAGNOSIS — M5032 Other cervical disc degeneration, mid-cervical region, unspecified level: Secondary | ICD-10-CM | POA: Diagnosis not present

## 2021-07-18 DIAGNOSIS — M5414 Radiculopathy, thoracic region: Secondary | ICD-10-CM | POA: Diagnosis not present

## 2021-07-18 DIAGNOSIS — M9901 Segmental and somatic dysfunction of cervical region: Secondary | ICD-10-CM | POA: Diagnosis not present

## 2021-07-20 DIAGNOSIS — M5412 Radiculopathy, cervical region: Secondary | ICD-10-CM | POA: Diagnosis not present

## 2021-07-22 DIAGNOSIS — M5414 Radiculopathy, thoracic region: Secondary | ICD-10-CM | POA: Diagnosis not present

## 2021-07-22 DIAGNOSIS — M5032 Other cervical disc degeneration, mid-cervical region, unspecified level: Secondary | ICD-10-CM | POA: Diagnosis not present

## 2021-07-22 DIAGNOSIS — M9902 Segmental and somatic dysfunction of thoracic region: Secondary | ICD-10-CM | POA: Diagnosis not present

## 2021-07-22 DIAGNOSIS — M9901 Segmental and somatic dysfunction of cervical region: Secondary | ICD-10-CM | POA: Diagnosis not present

## 2021-07-25 DIAGNOSIS — M5032 Other cervical disc degeneration, mid-cervical region, unspecified level: Secondary | ICD-10-CM | POA: Diagnosis not present

## 2021-07-25 DIAGNOSIS — M5414 Radiculopathy, thoracic region: Secondary | ICD-10-CM | POA: Diagnosis not present

## 2021-07-25 DIAGNOSIS — M9902 Segmental and somatic dysfunction of thoracic region: Secondary | ICD-10-CM | POA: Diagnosis not present

## 2021-07-25 DIAGNOSIS — M9901 Segmental and somatic dysfunction of cervical region: Secondary | ICD-10-CM | POA: Diagnosis not present

## 2021-07-28 DIAGNOSIS — G4733 Obstructive sleep apnea (adult) (pediatric): Secondary | ICD-10-CM | POA: Diagnosis not present

## 2021-07-29 DIAGNOSIS — M5032 Other cervical disc degeneration, mid-cervical region, unspecified level: Secondary | ICD-10-CM | POA: Diagnosis not present

## 2021-07-29 DIAGNOSIS — M9901 Segmental and somatic dysfunction of cervical region: Secondary | ICD-10-CM | POA: Diagnosis not present

## 2021-07-29 DIAGNOSIS — M5414 Radiculopathy, thoracic region: Secondary | ICD-10-CM | POA: Diagnosis not present

## 2021-07-29 DIAGNOSIS — M9902 Segmental and somatic dysfunction of thoracic region: Secondary | ICD-10-CM | POA: Diagnosis not present

## 2021-08-01 DIAGNOSIS — M9901 Segmental and somatic dysfunction of cervical region: Secondary | ICD-10-CM | POA: Diagnosis not present

## 2021-08-01 DIAGNOSIS — M5032 Other cervical disc degeneration, mid-cervical region, unspecified level: Secondary | ICD-10-CM | POA: Diagnosis not present

## 2021-08-01 DIAGNOSIS — M5414 Radiculopathy, thoracic region: Secondary | ICD-10-CM | POA: Diagnosis not present

## 2021-08-01 DIAGNOSIS — M9902 Segmental and somatic dysfunction of thoracic region: Secondary | ICD-10-CM | POA: Diagnosis not present

## 2021-08-04 DIAGNOSIS — M9901 Segmental and somatic dysfunction of cervical region: Secondary | ICD-10-CM | POA: Diagnosis not present

## 2021-08-04 DIAGNOSIS — M5032 Other cervical disc degeneration, mid-cervical region, unspecified level: Secondary | ICD-10-CM | POA: Diagnosis not present

## 2021-08-04 DIAGNOSIS — M5414 Radiculopathy, thoracic region: Secondary | ICD-10-CM | POA: Diagnosis not present

## 2021-08-04 DIAGNOSIS — M9902 Segmental and somatic dysfunction of thoracic region: Secondary | ICD-10-CM | POA: Diagnosis not present

## 2021-08-07 ENCOUNTER — Encounter (HOSPITAL_BASED_OUTPATIENT_CLINIC_OR_DEPARTMENT_OTHER): Payer: Self-pay | Admitting: Emergency Medicine

## 2021-08-07 ENCOUNTER — Emergency Department (HOSPITAL_BASED_OUTPATIENT_CLINIC_OR_DEPARTMENT_OTHER)
Admission: EM | Admit: 2021-08-07 | Discharge: 2021-08-08 | Disposition: A | Discharge status: Home / Self Care | Payer: Medicare PPO | Attending: Emergency Medicine | Admitting: Emergency Medicine

## 2021-08-07 DIAGNOSIS — N3000 Acute cystitis without hematuria: Secondary | ICD-10-CM | POA: Diagnosis not present

## 2021-08-07 DIAGNOSIS — K529 Noninfective gastroenteritis and colitis, unspecified: Secondary | ICD-10-CM | POA: Diagnosis not present

## 2021-08-07 DIAGNOSIS — K7689 Other specified diseases of liver: Secondary | ICD-10-CM | POA: Diagnosis not present

## 2021-08-07 DIAGNOSIS — Z85850 Personal history of malignant neoplasm of thyroid: Secondary | ICD-10-CM | POA: Insufficient documentation

## 2021-08-07 DIAGNOSIS — K429 Umbilical hernia without obstruction or gangrene: Secondary | ICD-10-CM | POA: Diagnosis not present

## 2021-08-07 DIAGNOSIS — N83201 Unspecified ovarian cyst, right side: Secondary | ICD-10-CM | POA: Diagnosis not present

## 2021-08-07 DIAGNOSIS — R1013 Epigastric pain: Secondary | ICD-10-CM | POA: Insufficient documentation

## 2021-08-07 DIAGNOSIS — R1033 Periumbilical pain: Secondary | ICD-10-CM | POA: Diagnosis not present

## 2021-08-07 DIAGNOSIS — R1084 Generalized abdominal pain: Secondary | ICD-10-CM

## 2021-08-07 NOTE — ED Notes (Addendum)
Unsuccessful stick x1. 

## 2021-08-07 NOTE — ED Triage Notes (Signed)
Pt reports abd cramping; no vomiting; sts she thinks it is food poisoning

## 2021-08-08 ENCOUNTER — Emergency Department (HOSPITAL_BASED_OUTPATIENT_CLINIC_OR_DEPARTMENT_OTHER): Payer: Medicare PPO

## 2021-08-08 DIAGNOSIS — M9902 Segmental and somatic dysfunction of thoracic region: Secondary | ICD-10-CM | POA: Diagnosis not present

## 2021-08-08 DIAGNOSIS — M5414 Radiculopathy, thoracic region: Secondary | ICD-10-CM | POA: Diagnosis not present

## 2021-08-08 DIAGNOSIS — M5032 Other cervical disc degeneration, mid-cervical region, unspecified level: Secondary | ICD-10-CM | POA: Diagnosis not present

## 2021-08-08 DIAGNOSIS — M9901 Segmental and somatic dysfunction of cervical region: Secondary | ICD-10-CM | POA: Diagnosis not present

## 2021-08-08 DIAGNOSIS — N83201 Unspecified ovarian cyst, right side: Secondary | ICD-10-CM | POA: Diagnosis not present

## 2021-08-08 DIAGNOSIS — K429 Umbilical hernia without obstruction or gangrene: Secondary | ICD-10-CM | POA: Diagnosis not present

## 2021-08-08 DIAGNOSIS — K7689 Other specified diseases of liver: Secondary | ICD-10-CM | POA: Diagnosis not present

## 2021-08-08 DIAGNOSIS — K529 Noninfective gastroenteritis and colitis, unspecified: Secondary | ICD-10-CM | POA: Diagnosis not present

## 2021-08-08 LAB — COMPREHENSIVE METABOLIC PANEL
ALT: 21 U/L (ref 0–44)
AST: 27 U/L (ref 15–41)
Albumin: 4.4 g/dL (ref 3.5–5.0)
Alkaline Phosphatase: 55 U/L (ref 38–126)
Anion gap: 10 (ref 5–15)
BUN: 14 mg/dL (ref 8–23)
CO2: 27 mmol/L (ref 22–32)
Calcium: 8.8 mg/dL — ABNORMAL LOW (ref 8.9–10.3)
Chloride: 103 mmol/L (ref 98–111)
Creatinine, Ser: 0.93 mg/dL (ref 0.44–1.00)
GFR, Estimated: >60 mL/min (ref >60)
Glucose, Bld: 106 mg/dL — ABNORMAL HIGH (ref 70–99)
Potassium: 3.2 mmol/L — ABNORMAL LOW (ref 3.5–5.1)
Sodium: 140 mmol/L (ref 135–145)
Total Bilirubin: 1.4 mg/dL — ABNORMAL HIGH (ref 0.3–1.2)
Total Protein: 7.2 g/dL (ref 6.5–8.1)

## 2021-08-08 LAB — LIPASE, BLOOD: Lipase: 26 U/L (ref 11–51)

## 2021-08-08 LAB — URINALYSIS, ROUTINE W REFLEX MICROSCOPIC
Bilirubin Urine: NEGATIVE
Glucose, UA: NEGATIVE mg/dL
Hgb urine dipstick: NEGATIVE
Ketones, ur: NEGATIVE mg/dL
Nitrite: NEGATIVE
Protein, ur: NEGATIVE mg/dL
Specific Gravity, Urine: 1.015 (ref 1.005–1.030)
pH: >=9 (ref 5.0–8.0)

## 2021-08-08 LAB — CBC
HCT: 38.6 % (ref 36.0–46.0)
Hemoglobin: 12.6 g/dL (ref 12.0–15.0)
MCH: 29.1 pg (ref 26.0–34.0)
MCHC: 32.6 g/dL (ref 30.0–36.0)
MCV: 89.1 fL (ref 80.0–100.0)
Platelets: 259 10*3/uL (ref 150–400)
RBC: 4.33 MIL/uL (ref 3.87–5.11)
RDW: 14.2 % (ref 11.5–15.5)
WBC: 9.2 10*3/uL (ref 4.0–10.5)
nRBC: 0 % (ref 0.0–0.2)

## 2021-08-08 LAB — TROPONIN I (HIGH SENSITIVITY): Troponin I (High Sensitivity): 2 ng/L (ref <18)

## 2021-08-08 LAB — URINALYSIS, MICROSCOPIC (REFLEX)

## 2021-08-08 MED ORDER — IOHEXOL 300 MG/ML  SOLN
100.0000 mL | Freq: Once | INTRAMUSCULAR | Status: AC | PRN
  Administered 2021-08-08: 100 mL via INTRAVENOUS

## 2021-08-08 MED ORDER — SODIUM CHLORIDE 0.9 % IV BOLUS
1000.0000 mL | Freq: Once | INTRAVENOUS | Status: AC
  Administered 2021-08-08: 1000 mL via INTRAVENOUS

## 2021-08-08 MED ORDER — ONDANSETRON HCL 4 MG/2ML IJ SOLN
4.0000 mg | Freq: Once | INTRAMUSCULAR | Status: AC
  Administered 2021-08-08: 4 mg via INTRAVENOUS
  Filled 2021-08-08: qty 2

## 2021-08-08 MED ORDER — CIPROFLOXACIN HCL 500 MG PO TABS
500.0000 mg | ORAL_TABLET | Freq: Once | ORAL | Status: AC
  Administered 2021-08-08: 500 mg via ORAL
  Filled 2021-08-08: qty 1

## 2021-08-08 MED ORDER — MORPHINE SULFATE (PF) 4 MG/ML IV SOLN
4.0000 mg | Freq: Once | INTRAVENOUS | Status: AC
  Administered 2021-08-08: 4 mg via INTRAVENOUS
  Filled 2021-08-08: qty 1

## 2021-08-08 MED ORDER — MORPHINE SULFATE (PF) 4 MG/ML IV SOLN
4.0000 mg | Freq: Once | INTRAVENOUS | Status: DC
  Filled 2021-08-08: qty 1

## 2021-08-08 MED ORDER — CIPROFLOXACIN HCL 500 MG PO TABS: 500.0000 mg | ORAL_TABLET | Freq: Two times a day (BID) | ORAL | 0 refills | Status: DC

## 2021-08-08 NOTE — Discharge Instructions (Addendum)
Begin taking Cipro as prescribed.  Return to the emergency department if you develop worsening pain, high fevers, bloody stools, or other new and concerning symptoms.

## 2021-08-08 NOTE — ED Provider Notes (Addendum)
[facility] Provider Note   CSN: [id number] Arrival date & time: 08/07/21  2226     History Chief Complaint  Patient presents with   Abdominal Pain    Peggy Wagner is a 65 y.o. female.  Patient is a 65 year old female with past medical history of thyroid cancer.  Patient presenting today with complaints of abdominal pain.  She describes pain to her periumbilical region that began earlier today.  She denies any nausea or vomiting.  She denies any fevers or chills.  She denies any bowel or bladder complaints.  She reports eating shrimp prior to the onset of symptoms and is concerned she may have food poisoning.  Patient has had colonoscopy in the past showing diverticulosis, but has never had diverticulitis.  The history is provided by the patient.  Abdominal Pain Pain location:  Epigastric and periumbilical Pain quality: cramping   Pain radiates to:  Does not radiate Pain severity:  Moderate Onset quality:  Sudden Duration:  12 hours Timing:  Constant Progression:  Worsening Chronicity:  New Relieved by:  Nothing Worsened by:  Nothing Ineffective treatments:  None tried     Past Medical History:  Diagnosis Date   CARCINOMA, THYROID GLAND, PAPILLARY 09/20/2007   12/08-thyroidectomy--1.8 cm right papillary adeno ca 07/09-I 131 109 [facility]  12/09 -tg neg (ab neg) 1/10-thyrogen scan neg 06/10-tg neg (ab neg) 02/12-tg neg (ab neg)   Dizziness and giddiness 07/17/2008   Hypoparathyroidism ([place]) 09/24/2007   HYPOTHYROIDISM, POSTSURGICAL 09/20/2007   INSOMNIA 09/20/2007   RECTAL BLEEDING, HX OF 05/20/2007   Sleep apnea     Patient Active Problem List   Diagnosis Date Noted   DIZZINESS AND GIDDINESS 07/17/2008   CONTUSION OF CHEST WALL 11/01/2007   HYPOPARATHYROIDISM 09/24/2007   CRAMP OF LIMB 09/24/2007   CARCINOMA, THYROID GLAND, PAPILLARY 09/20/2007   HYPOTHYROIDISM, POSTSURGICAL 09/20/2007   INSOMNIA 09/20/2007   RECTAL BLEEDING, HX OF  05/20/2007    Past Surgical History:  Procedure Laterality Date   DILATATION & CURETTAGE/HYSTEROSCOPY WITH MYOSURE N/A 09/15/2020   Procedure: DILATATION AND CURETTAGE /HYSTEROSCOPY WITH MYOSURE;  Surgeon: Christophe Louis, MD;  Location: [facility];  Service: Gynecology;  Laterality: N/A;     OB History   No obstetric history on file.     Family History  Problem Relation Age of Onset   Thyroid disease Sister        uncertain type   Osteoporosis Other    Cancer Other        FH of Prostate Cancer-1st degree relative    Social History   Tobacco Use   Smoking status: Never   Smokeless tobacco: Never  Vaping Use   Vaping Use: Never used  Substance Use Topics   Alcohol use: Yes    Comment: occasional   Drug use: No    Home Medications Prior to Admission medications   Medication Sig Start Date End Date Taking? Authorizing Provider  calcitRIOL (ROCALTROL) 0.25 MCG capsule TAKE (1) CAPSULE DAILY. 08/10/16   Renato Shin, MD  Calcium Carbonate-Vitamin D (CALCIUM + D PO) Take 1 tablet by mouth daily.    [provider]  cholecalciferol (VITAMIN D3) 25 MCG (1000 UNIT) tablet Take 1,000 Units by mouth daily.    [provider]  ibuprofen (ADVIL) 800 MG tablet Take 1 tablet (800 mg total) by mouth every 8 (eight) hours as needed. 09/15/20   Christophe Louis, MD  levothyroxine (SYNTHROID, LEVOTHROID) 88 MCG tablet Take 1 tablet (  88 mcg total) by mouth daily before breakfast. 04/26/17   Renato Shin, MD  Multiple Vitamins-Minerals (CENTRUM SILVER ULTRA WOMENS) TABS Take 1 tablet by mouth daily.    [provider]  rosuvastatin (CRESTOR) 10 MG tablet Take 10 mg by mouth daily.    [provider]    Allergies    Codeine, Erythromycin, and Penicillins  Review of Systems   Review of Systems  Gastrointestinal:  Positive for abdominal pain.  All other systems reviewed and are negative.  Physical Exam Updated Vital Signs BP (!) 141/84    Pulse 83   Temp 98.4 F (36.9 C) (Oral)   Resp 16   Ht 5\' 4"  (1.626 m)   Wt 93 kg   SpO2 99%   BMI 35.19 kg/m   Physical Exam Vitals and nursing note reviewed.  Constitutional:      General: She is not in acute distress.    Appearance: She is well-developed. She is not diaphoretic.  HENT:     Head: Normocephalic and atraumatic.  Cardiovascular:     Rate and Rhythm: Normal rate and regular rhythm.     Heart sounds: No murmur heard.   No friction rub. No gallop.  Pulmonary:     Effort: Pulmonary effort is normal. No respiratory distress.     Breath sounds: Normal breath sounds. No wheezing.  Abdominal:     General: Bowel sounds are normal. There is no distension.     Palpations: Abdomen is soft.     Tenderness: There is abdominal tenderness in the epigastric area and periumbilical area. There is no right CVA tenderness, left CVA tenderness, guarding or rebound.  Musculoskeletal:        General: Normal range of motion.     Cervical back: Normal range of motion and neck supple.  Skin:    General: Skin is warm and dry.  Neurological:     General: No focal deficit present.     Mental Status: She is alert and oriented to person, place, and time.    ED Results / Procedures / Treatments   Labs (all labs ordered are listed, but only abnormal results are displayed) Labs Reviewed  CBC  LIPASE, BLOOD  COMPREHENSIVE METABOLIC PANEL  URINALYSIS, ROUTINE W REFLEX MICROSCOPIC  TROPONIN I (HIGH SENSITIVITY)    EKG EKG Interpretation  Date/Time:  Sunday August 07 2021 22:53:07 EST Ventricular Rate:  95 PR Interval:  190 QRS Duration: 60 QT Interval:  332 QTC Calculation: 417 R Axis:   61 Text Interpretation: Normal sinus rhythm Normal ECG Confirmed by Veryl Speak [phone]) on 08/08/2021 12:28:38 AM  Radiology No results found.  Procedures Procedures   Medications Ordered in ED Medications  sodium chloride 0.9 % bolus 1,000 mL (has no administration in time range)   ondansetron (ZOFRAN) injection 4 mg (has no administration in time range)  morphine 4 MG/ML injection 4 mg (has no administration in time range)    ED Course  I have reviewed the triage vital signs and the nursing notes.  Pertinent labs & imaging results that were available during my care of the patient were reviewed by me and considered in my medical decision making (see chart for details).    MDM Rules/Calculators/A&P  Patient feeling better after receiving medications here in the ER.  Her work-up reveals evidence for urinary tract infection as well as inflammatory change in a section of her small intestine consistent with enteritis.  Whether this is bacterial or viral, I am uncertain,  but as the patient has what appears to be the beginnings of a UTI, I will treat with Cipro and see how she responds.  Patient declines pain medication.  Final Clinical Impression(s) / ED Diagnoses Final diagnoses:  None    Rx / DC Orders ED Discharge Orders     None        Veryl Speak, MD 08/08/21 [id number]    Veryl Speak, MD 08/08/21 0422

## 2021-08-08 NOTE — ED Notes (Signed)
Patient transported to CT 

## 2021-08-15 DIAGNOSIS — M9901 Segmental and somatic dysfunction of cervical region: Secondary | ICD-10-CM | POA: Diagnosis not present

## 2021-08-15 DIAGNOSIS — M5032 Other cervical disc degeneration, mid-cervical region, unspecified level: Secondary | ICD-10-CM | POA: Diagnosis not present

## 2021-08-15 DIAGNOSIS — M9902 Segmental and somatic dysfunction of thoracic region: Secondary | ICD-10-CM | POA: Diagnosis not present

## 2021-08-15 DIAGNOSIS — M5414 Radiculopathy, thoracic region: Secondary | ICD-10-CM | POA: Diagnosis not present

## 2021-08-22 DIAGNOSIS — M5032 Other cervical disc degeneration, mid-cervical region, unspecified level: Secondary | ICD-10-CM | POA: Diagnosis not present

## 2021-08-22 DIAGNOSIS — M9901 Segmental and somatic dysfunction of cervical region: Secondary | ICD-10-CM | POA: Diagnosis not present

## 2021-08-22 DIAGNOSIS — M9902 Segmental and somatic dysfunction of thoracic region: Secondary | ICD-10-CM | POA: Diagnosis not present

## 2021-08-22 DIAGNOSIS — M5414 Radiculopathy, thoracic region: Secondary | ICD-10-CM | POA: Diagnosis not present

## 2021-08-29 DIAGNOSIS — M9902 Segmental and somatic dysfunction of thoracic region: Secondary | ICD-10-CM | POA: Diagnosis not present

## 2021-08-29 DIAGNOSIS — M5032 Other cervical disc degeneration, mid-cervical region, unspecified level: Secondary | ICD-10-CM | POA: Diagnosis not present

## 2021-08-29 DIAGNOSIS — M9901 Segmental and somatic dysfunction of cervical region: Secondary | ICD-10-CM | POA: Diagnosis not present

## 2021-08-29 DIAGNOSIS — M5414 Radiculopathy, thoracic region: Secondary | ICD-10-CM | POA: Diagnosis not present

## 2021-09-02 DIAGNOSIS — H2513 Age-related nuclear cataract, bilateral: Secondary | ICD-10-CM | POA: Diagnosis not present

## 2021-09-02 DIAGNOSIS — H40033 Anatomical narrow angle, bilateral: Secondary | ICD-10-CM | POA: Diagnosis not present

## 2021-09-02 DIAGNOSIS — H43813 Vitreous degeneration, bilateral: Secondary | ICD-10-CM | POA: Diagnosis not present

## 2021-09-02 DIAGNOSIS — H43393 Other vitreous opacities, bilateral: Secondary | ICD-10-CM | POA: Diagnosis not present

## 2021-09-05 DIAGNOSIS — M5032 Other cervical disc degeneration, mid-cervical region, unspecified level: Secondary | ICD-10-CM | POA: Diagnosis not present

## 2021-09-05 DIAGNOSIS — M5414 Radiculopathy, thoracic region: Secondary | ICD-10-CM | POA: Diagnosis not present

## 2021-09-05 DIAGNOSIS — M9902 Segmental and somatic dysfunction of thoracic region: Secondary | ICD-10-CM | POA: Diagnosis not present

## 2021-09-05 DIAGNOSIS — M9901 Segmental and somatic dysfunction of cervical region: Secondary | ICD-10-CM | POA: Diagnosis not present

## 2021-09-06 DIAGNOSIS — Z780 Asymptomatic menopausal state: Secondary | ICD-10-CM | POA: Diagnosis not present

## 2021-10-03 DIAGNOSIS — M9902 Segmental and somatic dysfunction of thoracic region: Secondary | ICD-10-CM | POA: Diagnosis not present

## 2021-10-03 DIAGNOSIS — M5032 Other cervical disc degeneration, mid-cervical region, unspecified level: Secondary | ICD-10-CM | POA: Diagnosis not present

## 2021-10-03 DIAGNOSIS — M5414 Radiculopathy, thoracic region: Secondary | ICD-10-CM | POA: Diagnosis not present

## 2021-10-03 DIAGNOSIS — M9901 Segmental and somatic dysfunction of cervical region: Secondary | ICD-10-CM | POA: Diagnosis not present

## 2021-11-04 DIAGNOSIS — M9902 Segmental and somatic dysfunction of thoracic region: Secondary | ICD-10-CM | POA: Diagnosis not present

## 2021-11-04 DIAGNOSIS — M5414 Radiculopathy, thoracic region: Secondary | ICD-10-CM | POA: Diagnosis not present

## 2021-11-04 DIAGNOSIS — M5032 Other cervical disc degeneration, mid-cervical region, unspecified level: Secondary | ICD-10-CM | POA: Diagnosis not present

## 2021-11-04 DIAGNOSIS — M9901 Segmental and somatic dysfunction of cervical region: Secondary | ICD-10-CM | POA: Diagnosis not present

## 2021-12-02 DIAGNOSIS — M9902 Segmental and somatic dysfunction of thoracic region: Secondary | ICD-10-CM | POA: Diagnosis not present

## 2021-12-02 DIAGNOSIS — M5414 Radiculopathy, thoracic region: Secondary | ICD-10-CM | POA: Diagnosis not present

## 2021-12-02 DIAGNOSIS — M9901 Segmental and somatic dysfunction of cervical region: Secondary | ICD-10-CM | POA: Diagnosis not present

## 2021-12-02 DIAGNOSIS — M5032 Other cervical disc degeneration, mid-cervical region, unspecified level: Secondary | ICD-10-CM | POA: Diagnosis not present

## 2021-12-30 DIAGNOSIS — M5032 Other cervical disc degeneration, mid-cervical region, unspecified level: Secondary | ICD-10-CM | POA: Diagnosis not present

## 2021-12-30 DIAGNOSIS — M9901 Segmental and somatic dysfunction of cervical region: Secondary | ICD-10-CM | POA: Diagnosis not present

## 2021-12-30 DIAGNOSIS — M5414 Radiculopathy, thoracic region: Secondary | ICD-10-CM | POA: Diagnosis not present

## 2021-12-30 DIAGNOSIS — M9902 Segmental and somatic dysfunction of thoracic region: Secondary | ICD-10-CM | POA: Diagnosis not present

## 2022-01-02 DIAGNOSIS — G4733 Obstructive sleep apnea (adult) (pediatric): Secondary | ICD-10-CM | POA: Diagnosis not present

## 2022-01-09 DIAGNOSIS — L602 Onychogryphosis: Secondary | ICD-10-CM | POA: Diagnosis not present

## 2022-01-09 DIAGNOSIS — L84 Corns and callosities: Secondary | ICD-10-CM | POA: Diagnosis not present

## 2022-01-09 DIAGNOSIS — M21962 Unspecified acquired deformity of left lower leg: Secondary | ICD-10-CM | POA: Diagnosis not present

## 2022-01-09 DIAGNOSIS — M21961 Unspecified acquired deformity of right lower leg: Secondary | ICD-10-CM | POA: Diagnosis not present

## 2022-01-09 DIAGNOSIS — B353 Tinea pedis: Secondary | ICD-10-CM | POA: Diagnosis not present

## 2022-01-09 DIAGNOSIS — M792 Neuralgia and neuritis, unspecified: Secondary | ICD-10-CM | POA: Diagnosis not present

## 2022-01-20 DIAGNOSIS — M5032 Other cervical disc degeneration, mid-cervical region, unspecified level: Secondary | ICD-10-CM | POA: Diagnosis not present

## 2022-01-20 DIAGNOSIS — M9901 Segmental and somatic dysfunction of cervical region: Secondary | ICD-10-CM | POA: Diagnosis not present

## 2022-01-20 DIAGNOSIS — M9902 Segmental and somatic dysfunction of thoracic region: Secondary | ICD-10-CM | POA: Diagnosis not present

## 2022-01-20 DIAGNOSIS — M5414 Radiculopathy, thoracic region: Secondary | ICD-10-CM | POA: Diagnosis not present

## 2022-01-27 DIAGNOSIS — M5414 Radiculopathy, thoracic region: Secondary | ICD-10-CM | POA: Diagnosis not present

## 2022-01-27 DIAGNOSIS — M5032 Other cervical disc degeneration, mid-cervical region, unspecified level: Secondary | ICD-10-CM | POA: Diagnosis not present

## 2022-01-27 DIAGNOSIS — M9901 Segmental and somatic dysfunction of cervical region: Secondary | ICD-10-CM | POA: Diagnosis not present

## 2022-01-27 DIAGNOSIS — M9902 Segmental and somatic dysfunction of thoracic region: Secondary | ICD-10-CM | POA: Diagnosis not present

## 2022-02-10 DIAGNOSIS — M9901 Segmental and somatic dysfunction of cervical region: Secondary | ICD-10-CM | POA: Diagnosis not present

## 2022-02-10 DIAGNOSIS — M5032 Other cervical disc degeneration, mid-cervical region, unspecified level: Secondary | ICD-10-CM | POA: Diagnosis not present

## 2022-02-10 DIAGNOSIS — M5414 Radiculopathy, thoracic region: Secondary | ICD-10-CM | POA: Diagnosis not present

## 2022-02-10 DIAGNOSIS — M9902 Segmental and somatic dysfunction of thoracic region: Secondary | ICD-10-CM | POA: Diagnosis not present

## 2022-02-13 DIAGNOSIS — E892 Postprocedural hypoparathyroidism: Secondary | ICD-10-CM | POA: Diagnosis not present

## 2022-02-13 DIAGNOSIS — Z85850 Personal history of malignant neoplasm of thyroid: Secondary | ICD-10-CM | POA: Diagnosis not present

## 2022-02-13 DIAGNOSIS — E890 Postprocedural hypothyroidism: Secondary | ICD-10-CM | POA: Diagnosis not present

## 2022-02-16 DIAGNOSIS — E890 Postprocedural hypothyroidism: Secondary | ICD-10-CM | POA: Diagnosis not present

## 2022-02-16 DIAGNOSIS — Z85850 Personal history of malignant neoplasm of thyroid: Secondary | ICD-10-CM | POA: Diagnosis not present

## 2022-02-16 DIAGNOSIS — E892 Postprocedural hypoparathyroidism: Secondary | ICD-10-CM | POA: Diagnosis not present

## 2022-02-24 DIAGNOSIS — M9901 Segmental and somatic dysfunction of cervical region: Secondary | ICD-10-CM | POA: Diagnosis not present

## 2022-02-24 DIAGNOSIS — M5032 Other cervical disc degeneration, mid-cervical region, unspecified level: Secondary | ICD-10-CM | POA: Diagnosis not present

## 2022-02-24 DIAGNOSIS — M5414 Radiculopathy, thoracic region: Secondary | ICD-10-CM | POA: Diagnosis not present

## 2022-02-24 DIAGNOSIS — M9902 Segmental and somatic dysfunction of thoracic region: Secondary | ICD-10-CM | POA: Diagnosis not present

## 2022-03-10 DIAGNOSIS — M5414 Radiculopathy, thoracic region: Secondary | ICD-10-CM | POA: Diagnosis not present

## 2022-03-10 DIAGNOSIS — M9901 Segmental and somatic dysfunction of cervical region: Secondary | ICD-10-CM | POA: Diagnosis not present

## 2022-03-10 DIAGNOSIS — M9902 Segmental and somatic dysfunction of thoracic region: Secondary | ICD-10-CM | POA: Diagnosis not present

## 2022-03-10 DIAGNOSIS — M5032 Other cervical disc degeneration, mid-cervical region, unspecified level: Secondary | ICD-10-CM | POA: Diagnosis not present

## 2022-04-10 DIAGNOSIS — M9902 Segmental and somatic dysfunction of thoracic region: Secondary | ICD-10-CM | POA: Diagnosis not present

## 2022-04-10 DIAGNOSIS — M5032 Other cervical disc degeneration, mid-cervical region, unspecified level: Secondary | ICD-10-CM | POA: Diagnosis not present

## 2022-04-10 DIAGNOSIS — M5414 Radiculopathy, thoracic region: Secondary | ICD-10-CM | POA: Diagnosis not present

## 2022-04-10 DIAGNOSIS — M9901 Segmental and somatic dysfunction of cervical region: Secondary | ICD-10-CM | POA: Diagnosis not present

## 2022-04-18 DIAGNOSIS — E890 Postprocedural hypothyroidism: Secondary | ICD-10-CM | POA: Diagnosis not present

## 2022-05-04 DIAGNOSIS — M25559 Pain in unspecified hip: Secondary | ICD-10-CM | POA: Diagnosis not present

## 2022-05-04 DIAGNOSIS — G720 Drug-induced myopathy: Secondary | ICD-10-CM | POA: Diagnosis not present

## 2022-05-04 DIAGNOSIS — Z0000 Encounter for general adult medical examination without abnormal findings: Secondary | ICD-10-CM | POA: Diagnosis not present

## 2022-05-04 DIAGNOSIS — E890 Postprocedural hypothyroidism: Secondary | ICD-10-CM | POA: Diagnosis not present

## 2022-05-04 DIAGNOSIS — G4733 Obstructive sleep apnea (adult) (pediatric): Secondary | ICD-10-CM | POA: Diagnosis not present

## 2022-05-04 DIAGNOSIS — E7800 Pure hypercholesterolemia, unspecified: Secondary | ICD-10-CM | POA: Diagnosis not present

## 2022-05-04 DIAGNOSIS — H9192 Unspecified hearing loss, left ear: Secondary | ICD-10-CM | POA: Diagnosis not present

## 2022-05-04 DIAGNOSIS — Z79899 Other long term (current) drug therapy: Secondary | ICD-10-CM | POA: Diagnosis not present

## 2022-05-04 DIAGNOSIS — Z85850 Personal history of malignant neoplasm of thyroid: Secondary | ICD-10-CM | POA: Diagnosis not present

## 2022-05-04 DIAGNOSIS — Z5181 Encounter for therapeutic drug level monitoring: Secondary | ICD-10-CM | POA: Diagnosis not present

## 2022-05-08 DIAGNOSIS — M5414 Radiculopathy, thoracic region: Secondary | ICD-10-CM | POA: Diagnosis not present

## 2022-05-08 DIAGNOSIS — M9901 Segmental and somatic dysfunction of cervical region: Secondary | ICD-10-CM | POA: Diagnosis not present

## 2022-05-08 DIAGNOSIS — M9902 Segmental and somatic dysfunction of thoracic region: Secondary | ICD-10-CM | POA: Diagnosis not present

## 2022-05-08 DIAGNOSIS — M5032 Other cervical disc degeneration, mid-cervical region, unspecified level: Secondary | ICD-10-CM | POA: Diagnosis not present

## 2022-06-05 DIAGNOSIS — M9902 Segmental and somatic dysfunction of thoracic region: Secondary | ICD-10-CM | POA: Diagnosis not present

## 2022-06-05 DIAGNOSIS — M9901 Segmental and somatic dysfunction of cervical region: Secondary | ICD-10-CM | POA: Diagnosis not present

## 2022-06-05 DIAGNOSIS — M5032 Other cervical disc degeneration, mid-cervical region, unspecified level: Secondary | ICD-10-CM | POA: Diagnosis not present

## 2022-06-05 DIAGNOSIS — M5414 Radiculopathy, thoracic region: Secondary | ICD-10-CM | POA: Diagnosis not present

## 2022-06-06 ENCOUNTER — Other Ambulatory Visit: Payer: Self-pay | Admitting: Internal Medicine

## 2022-06-06 DIAGNOSIS — E7800 Pure hypercholesterolemia, unspecified: Secondary | ICD-10-CM

## 2022-06-27 DIAGNOSIS — R0789 Other chest pain: Secondary | ICD-10-CM | POA: Diagnosis not present

## 2022-06-28 IMAGING — DX DG CERVICAL SPINE 2 OR 3 VIEWS
3 series · 3 of 3 positions shown · non-contrast
Comparison: The 01/05/2011

CLINICAL DATA: Cervical radiculopathy

EXAM:
CERVICAL SPINE - 2-3 VIEW

[dg cervical spine 2 or 3 views (1 of 3)]
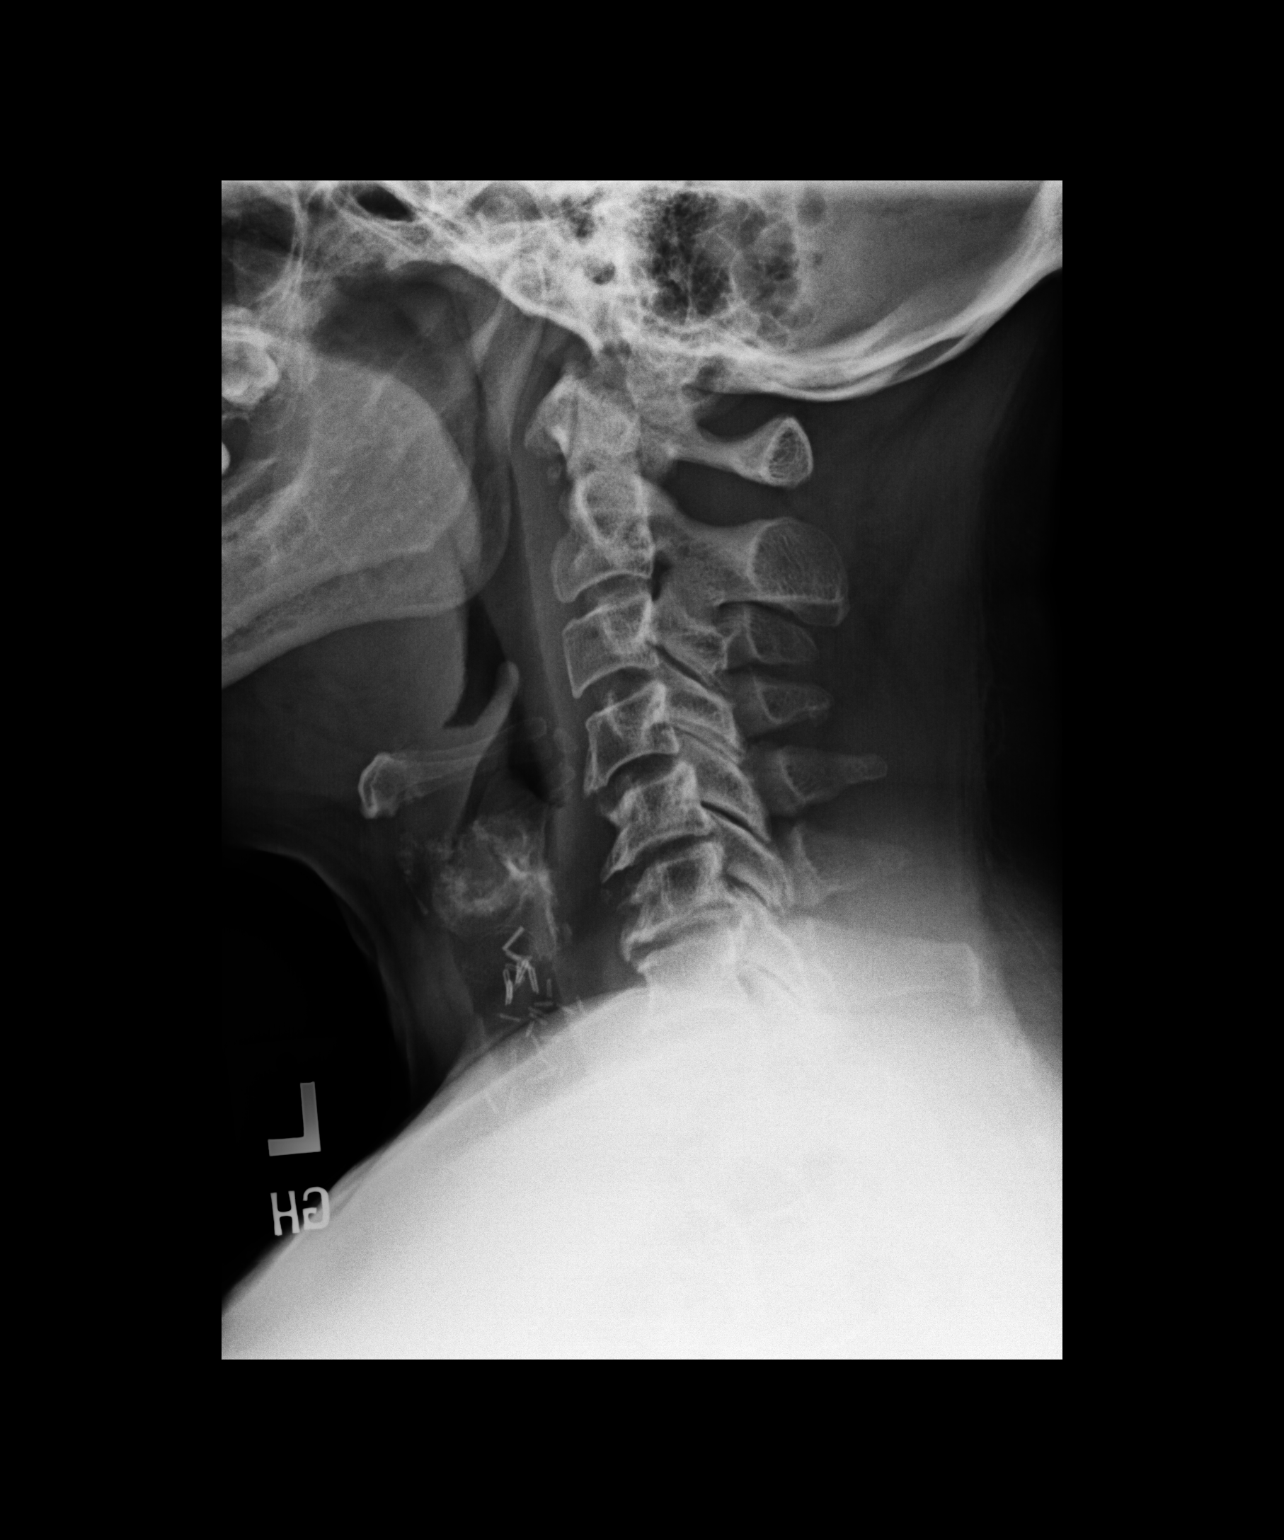

[dg cervical spine 2 or 3 views (2 of 3)]
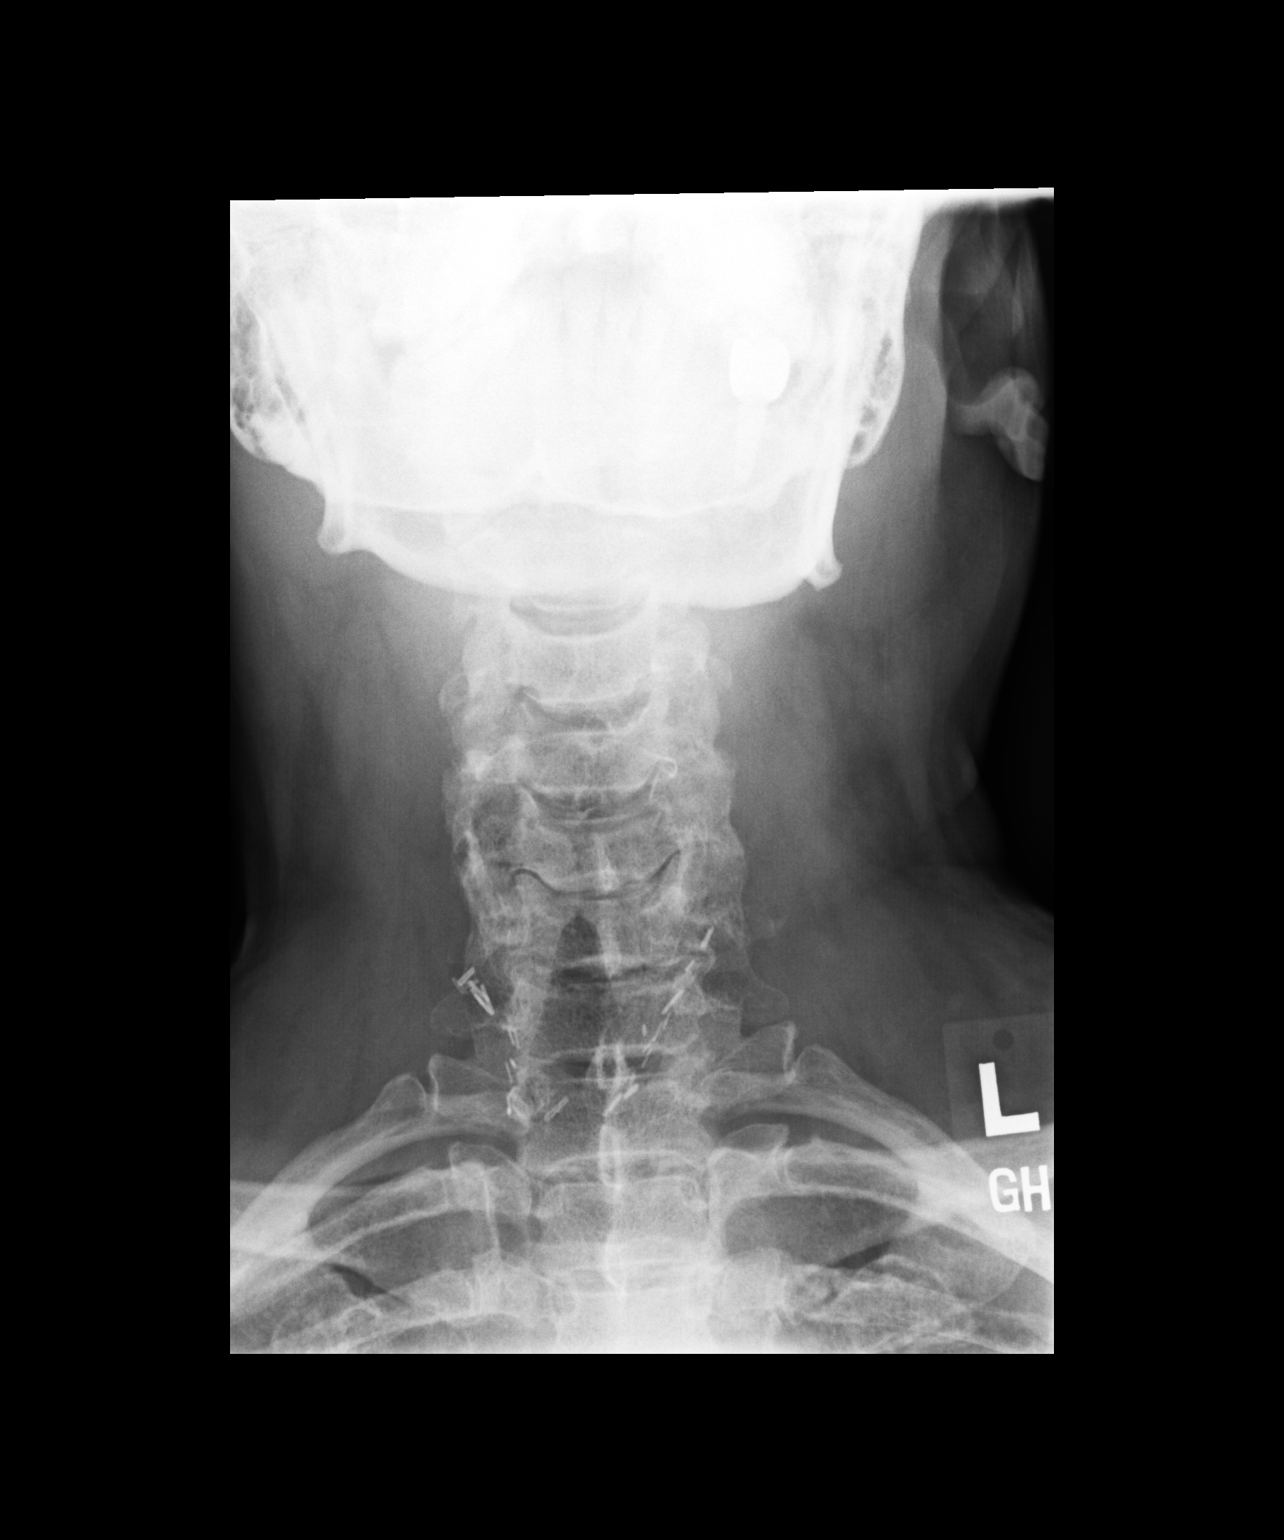

[dg cervical spine 2 or 3 views (3 of 3)]
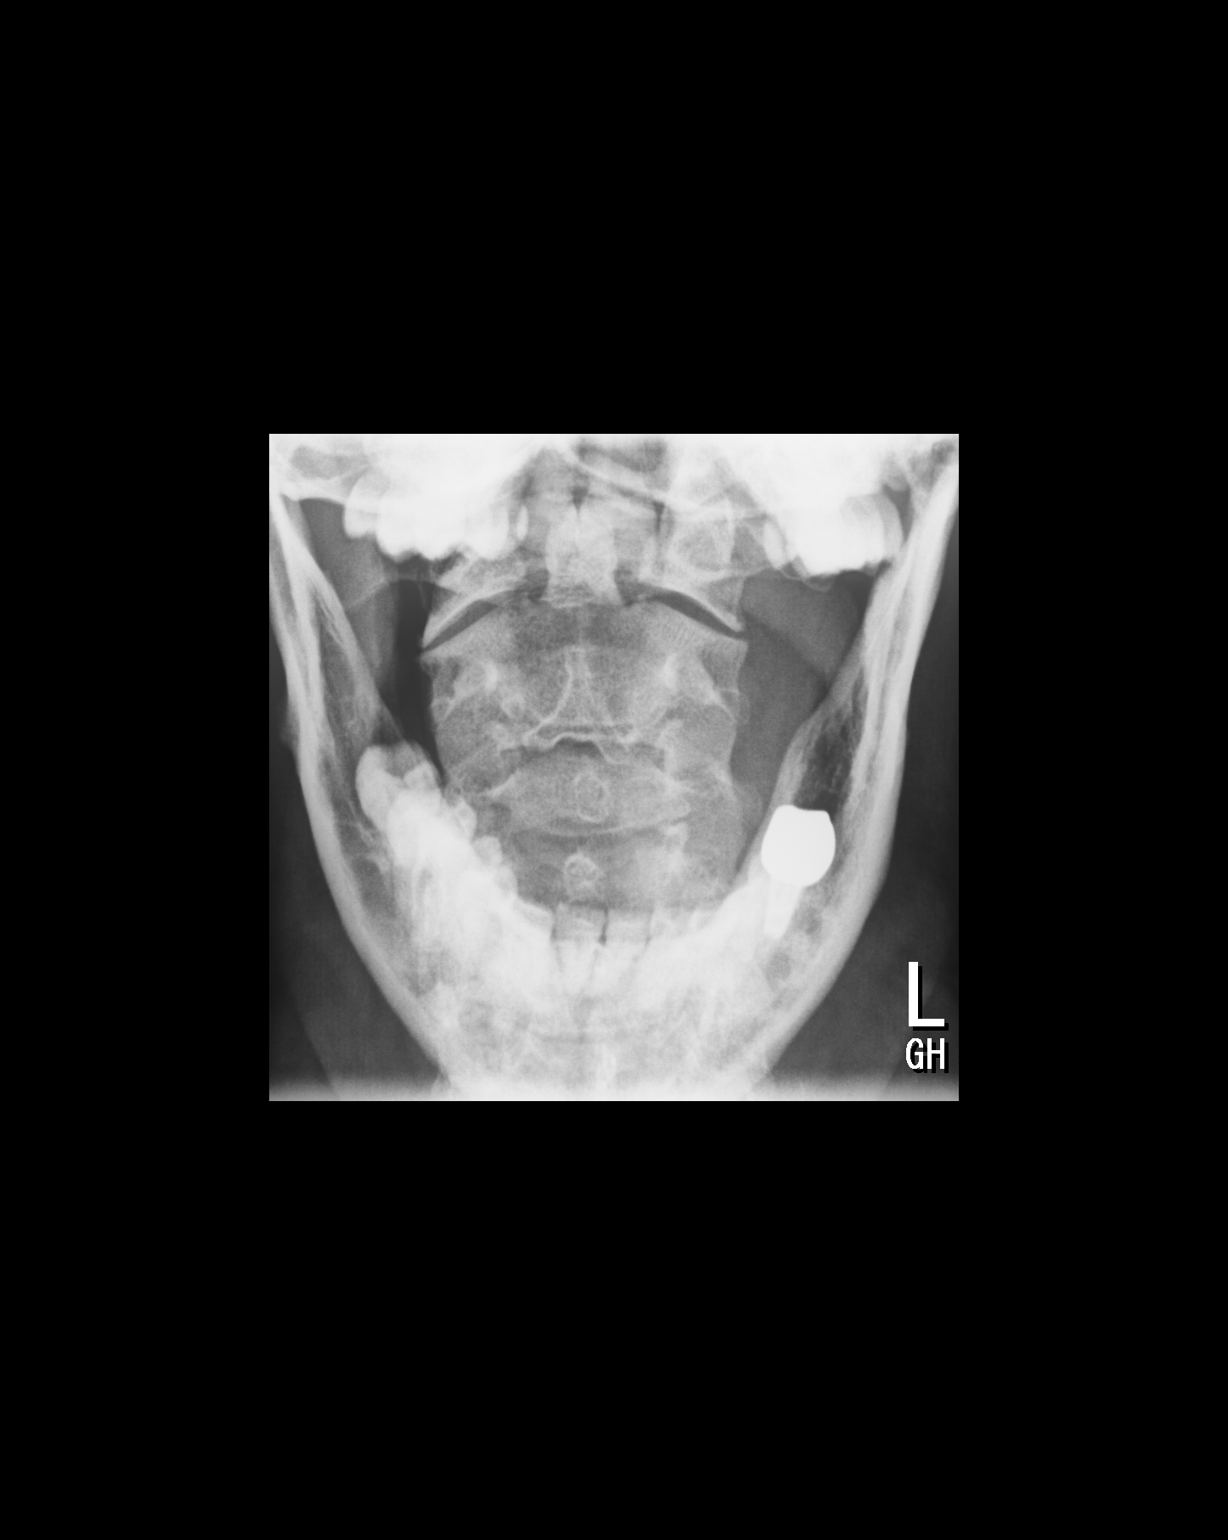

[3 of 3 positions shown; findings below may reference images not displayed]

FINDINGS: Mid to lower cervical degenerative changes noted spanning C4 through
C7. These levels demonstrate slight progressive disc space
narrowing, sclerosis and large anterior osteophytes. No malalignment
or acute osseous finding. Normal prevertebral soft tissues. Remote
thyroidectomy changes. Intact odontoid. Clear lung apices.
IMPRESSION: Moderate progressive degenerative changes spanning C4 through C7.

No acute finding by plain radiography

## 2022-07-03 DIAGNOSIS — M5032 Other cervical disc degeneration, mid-cervical region, unspecified level: Secondary | ICD-10-CM | POA: Diagnosis not present

## 2022-07-03 DIAGNOSIS — M9902 Segmental and somatic dysfunction of thoracic region: Secondary | ICD-10-CM | POA: Diagnosis not present

## 2022-07-03 DIAGNOSIS — M9901 Segmental and somatic dysfunction of cervical region: Secondary | ICD-10-CM | POA: Diagnosis not present

## 2022-07-03 DIAGNOSIS — M5414 Radiculopathy, thoracic region: Secondary | ICD-10-CM | POA: Diagnosis not present

## 2022-07-24 DIAGNOSIS — M5032 Other cervical disc degeneration, mid-cervical region, unspecified level: Secondary | ICD-10-CM | POA: Diagnosis not present

## 2022-07-24 DIAGNOSIS — M9901 Segmental and somatic dysfunction of cervical region: Secondary | ICD-10-CM | POA: Diagnosis not present

## 2022-07-24 DIAGNOSIS — M5414 Radiculopathy, thoracic region: Secondary | ICD-10-CM | POA: Diagnosis not present

## 2022-07-24 DIAGNOSIS — M9902 Segmental and somatic dysfunction of thoracic region: Secondary | ICD-10-CM | POA: Diagnosis not present

## 2022-08-02 DIAGNOSIS — E890 Postprocedural hypothyroidism: Secondary | ICD-10-CM | POA: Diagnosis not present

## 2022-08-14 DIAGNOSIS — M5032 Other cervical disc degeneration, mid-cervical region, unspecified level: Secondary | ICD-10-CM | POA: Diagnosis not present

## 2022-08-14 DIAGNOSIS — M9902 Segmental and somatic dysfunction of thoracic region: Secondary | ICD-10-CM | POA: Diagnosis not present

## 2022-08-14 DIAGNOSIS — Z85850 Personal history of malignant neoplasm of thyroid: Secondary | ICD-10-CM | POA: Diagnosis not present

## 2022-08-14 DIAGNOSIS — M9901 Segmental and somatic dysfunction of cervical region: Secondary | ICD-10-CM | POA: Diagnosis not present

## 2022-08-14 DIAGNOSIS — M5414 Radiculopathy, thoracic region: Secondary | ICD-10-CM | POA: Diagnosis not present

## 2022-08-14 DIAGNOSIS — E890 Postprocedural hypothyroidism: Secondary | ICD-10-CM | POA: Diagnosis not present

## 2022-08-14 DIAGNOSIS — E892 Postprocedural hypoparathyroidism: Secondary | ICD-10-CM | POA: Diagnosis not present

## 2022-08-28 ENCOUNTER — Other Ambulatory Visit: Payer: Medicare PPO

## 2022-09-08 DIAGNOSIS — H43813 Vitreous degeneration, bilateral: Secondary | ICD-10-CM | POA: Diagnosis not present

## 2022-09-08 DIAGNOSIS — H2513 Age-related nuclear cataract, bilateral: Secondary | ICD-10-CM | POA: Diagnosis not present

## 2022-09-08 DIAGNOSIS — H40033 Anatomical narrow angle, bilateral: Secondary | ICD-10-CM | POA: Diagnosis not present

## 2022-09-08 DIAGNOSIS — H43393 Other vitreous opacities, bilateral: Secondary | ICD-10-CM | POA: Diagnosis not present

## 2022-09-11 DIAGNOSIS — M9902 Segmental and somatic dysfunction of thoracic region: Secondary | ICD-10-CM | POA: Diagnosis not present

## 2022-09-11 DIAGNOSIS — M9901 Segmental and somatic dysfunction of cervical region: Secondary | ICD-10-CM | POA: Diagnosis not present

## 2022-09-11 DIAGNOSIS — M5414 Radiculopathy, thoracic region: Secondary | ICD-10-CM | POA: Diagnosis not present

## 2022-09-11 DIAGNOSIS — M5032 Other cervical disc degeneration, mid-cervical region, unspecified level: Secondary | ICD-10-CM | POA: Diagnosis not present

## 2022-09-29 ENCOUNTER — Ambulatory Visit
Admission: RE | Admit: 2022-09-29 | Discharge: 2022-09-29 | Disposition: A | Discharge status: Home / Self Care | Payer: No Typology Code available for payment source | Source: Ambulatory Visit | Attending: Internal Medicine | Admitting: Internal Medicine

## 2022-09-29 DIAGNOSIS — E7800 Pure hypercholesterolemia, unspecified: Secondary | ICD-10-CM

## 2022-10-09 DIAGNOSIS — G4733 Obstructive sleep apnea (adult) (pediatric): Secondary | ICD-10-CM | POA: Diagnosis not present

## 2022-10-10 DIAGNOSIS — M9901 Segmental and somatic dysfunction of cervical region: Secondary | ICD-10-CM | POA: Diagnosis not present

## 2022-10-10 DIAGNOSIS — M5032 Other cervical disc degeneration, mid-cervical region, unspecified level: Secondary | ICD-10-CM | POA: Diagnosis not present

## 2022-10-10 DIAGNOSIS — M9902 Segmental and somatic dysfunction of thoracic region: Secondary | ICD-10-CM | POA: Diagnosis not present

## 2022-10-10 DIAGNOSIS — M5414 Radiculopathy, thoracic region: Secondary | ICD-10-CM | POA: Diagnosis not present

## 2022-11-06 DIAGNOSIS — M9902 Segmental and somatic dysfunction of thoracic region: Secondary | ICD-10-CM | POA: Diagnosis not present

## 2022-11-06 DIAGNOSIS — M9901 Segmental and somatic dysfunction of cervical region: Secondary | ICD-10-CM | POA: Diagnosis not present

## 2022-11-06 DIAGNOSIS — M5414 Radiculopathy, thoracic region: Secondary | ICD-10-CM | POA: Diagnosis not present

## 2022-11-06 DIAGNOSIS — M5032 Other cervical disc degeneration, mid-cervical region, unspecified level: Secondary | ICD-10-CM | POA: Diagnosis not present

## 2022-12-04 DIAGNOSIS — M5414 Radiculopathy, thoracic region: Secondary | ICD-10-CM | POA: Diagnosis not present

## 2022-12-04 DIAGNOSIS — M5032 Other cervical disc degeneration, mid-cervical region, unspecified level: Secondary | ICD-10-CM | POA: Diagnosis not present

## 2022-12-04 DIAGNOSIS — M9901 Segmental and somatic dysfunction of cervical region: Secondary | ICD-10-CM | POA: Diagnosis not present

## 2022-12-04 DIAGNOSIS — M9902 Segmental and somatic dysfunction of thoracic region: Secondary | ICD-10-CM | POA: Diagnosis not present

## 2022-12-07 DIAGNOSIS — J069 Acute upper respiratory infection, unspecified: Secondary | ICD-10-CM | POA: Diagnosis not present

## 2022-12-12 DIAGNOSIS — J40 Bronchitis, not specified as acute or chronic: Secondary | ICD-10-CM | POA: Diagnosis not present

## 2023-01-08 DIAGNOSIS — M9902 Segmental and somatic dysfunction of thoracic region: Secondary | ICD-10-CM | POA: Diagnosis not present

## 2023-01-08 DIAGNOSIS — M9901 Segmental and somatic dysfunction of cervical region: Secondary | ICD-10-CM | POA: Diagnosis not present

## 2023-01-08 DIAGNOSIS — G4733 Obstructive sleep apnea (adult) (pediatric): Secondary | ICD-10-CM | POA: Diagnosis not present

## 2023-01-08 DIAGNOSIS — M5032 Other cervical disc degeneration, mid-cervical region, unspecified level: Secondary | ICD-10-CM | POA: Diagnosis not present

## 2023-01-08 DIAGNOSIS — M5414 Radiculopathy, thoracic region: Secondary | ICD-10-CM | POA: Diagnosis not present

## 2023-01-08 DIAGNOSIS — E039 Hypothyroidism, unspecified: Secondary | ICD-10-CM | POA: Diagnosis not present

## 2023-02-05 DIAGNOSIS — E890 Postprocedural hypothyroidism: Secondary | ICD-10-CM | POA: Diagnosis not present

## 2023-02-05 DIAGNOSIS — E892 Postprocedural hypoparathyroidism: Secondary | ICD-10-CM | POA: Diagnosis not present

## 2023-02-05 DIAGNOSIS — Z85850 Personal history of malignant neoplasm of thyroid: Secondary | ICD-10-CM | POA: Diagnosis not present

## 2023-02-12 DIAGNOSIS — E890 Postprocedural hypothyroidism: Secondary | ICD-10-CM | POA: Diagnosis not present

## 2023-02-12 DIAGNOSIS — Z85850 Personal history of malignant neoplasm of thyroid: Secondary | ICD-10-CM | POA: Diagnosis not present

## 2023-02-12 DIAGNOSIS — M9901 Segmental and somatic dysfunction of cervical region: Secondary | ICD-10-CM | POA: Diagnosis not present

## 2023-02-12 DIAGNOSIS — R0602 Shortness of breath: Secondary | ICD-10-CM | POA: Diagnosis not present

## 2023-02-12 DIAGNOSIS — E892 Postprocedural hypoparathyroidism: Secondary | ICD-10-CM | POA: Diagnosis not present

## 2023-02-12 DIAGNOSIS — M9902 Segmental and somatic dysfunction of thoracic region: Secondary | ICD-10-CM | POA: Diagnosis not present

## 2023-02-12 DIAGNOSIS — M5032 Other cervical disc degeneration, mid-cervical region, unspecified level: Secondary | ICD-10-CM | POA: Diagnosis not present

## 2023-02-12 DIAGNOSIS — M5414 Radiculopathy, thoracic region: Secondary | ICD-10-CM | POA: Diagnosis not present

## 2023-02-13 DIAGNOSIS — M25512 Pain in left shoulder: Secondary | ICD-10-CM | POA: Diagnosis not present

## 2023-02-13 DIAGNOSIS — M7551 Bursitis of right shoulder: Secondary | ICD-10-CM | POA: Diagnosis not present

## 2023-02-13 DIAGNOSIS — M7552 Bursitis of left shoulder: Secondary | ICD-10-CM | POA: Diagnosis not present

## 2023-02-13 DIAGNOSIS — M25511 Pain in right shoulder: Secondary | ICD-10-CM | POA: Diagnosis not present

## 2023-02-23 DIAGNOSIS — M6281 Muscle weakness (generalized): Secondary | ICD-10-CM | POA: Diagnosis not present

## 2023-02-23 DIAGNOSIS — M25512 Pain in left shoulder: Secondary | ICD-10-CM | POA: Diagnosis not present

## 2023-02-23 DIAGNOSIS — M25511 Pain in right shoulder: Secondary | ICD-10-CM | POA: Diagnosis not present

## 2023-02-23 DIAGNOSIS — R293 Abnormal posture: Secondary | ICD-10-CM | POA: Diagnosis not present

## 2023-02-26 DIAGNOSIS — R293 Abnormal posture: Secondary | ICD-10-CM | POA: Diagnosis not present

## 2023-02-26 DIAGNOSIS — M25512 Pain in left shoulder: Secondary | ICD-10-CM | POA: Diagnosis not present

## 2023-02-26 DIAGNOSIS — M25511 Pain in right shoulder: Secondary | ICD-10-CM | POA: Diagnosis not present

## 2023-02-26 DIAGNOSIS — M6281 Muscle weakness (generalized): Secondary | ICD-10-CM | POA: Diagnosis not present

## 2023-02-28 DIAGNOSIS — M6281 Muscle weakness (generalized): Secondary | ICD-10-CM | POA: Diagnosis not present

## 2023-02-28 DIAGNOSIS — M25512 Pain in left shoulder: Secondary | ICD-10-CM | POA: Diagnosis not present

## 2023-02-28 DIAGNOSIS — R293 Abnormal posture: Secondary | ICD-10-CM | POA: Diagnosis not present

## 2023-02-28 DIAGNOSIS — M25511 Pain in right shoulder: Secondary | ICD-10-CM | POA: Diagnosis not present

## 2023-03-05 ENCOUNTER — Other Ambulatory Visit: Payer: Self-pay

## 2023-03-05 ENCOUNTER — Encounter (HOSPITAL_BASED_OUTPATIENT_CLINIC_OR_DEPARTMENT_OTHER): Payer: Self-pay | Admitting: Emergency Medicine

## 2023-03-05 ENCOUNTER — Emergency Department (HOSPITAL_BASED_OUTPATIENT_CLINIC_OR_DEPARTMENT_OTHER)
Admission: EM | Admit: 2023-03-05 | Discharge: 2023-03-05 | Disposition: A | Discharge status: Home / Self Care | Payer: Medicare PPO | Attending: Emergency Medicine | Admitting: Emergency Medicine

## 2023-03-05 ENCOUNTER — Emergency Department (HOSPITAL_BASED_OUTPATIENT_CLINIC_OR_DEPARTMENT_OTHER): Payer: Medicare PPO

## 2023-03-05 DIAGNOSIS — R519 Headache, unspecified: Secondary | ICD-10-CM | POA: Diagnosis not present

## 2023-03-05 DIAGNOSIS — R197 Diarrhea, unspecified: Secondary | ICD-10-CM | POA: Diagnosis not present

## 2023-03-05 DIAGNOSIS — E890 Postprocedural hypothyroidism: Secondary | ICD-10-CM | POA: Diagnosis not present

## 2023-03-05 DIAGNOSIS — R1032 Left lower quadrant pain: Secondary | ICD-10-CM | POA: Insufficient documentation

## 2023-03-05 DIAGNOSIS — Z85850 Personal history of malignant neoplasm of thyroid: Secondary | ICD-10-CM | POA: Diagnosis not present

## 2023-03-05 DIAGNOSIS — R195 Other fecal abnormalities: Secondary | ICD-10-CM

## 2023-03-05 DIAGNOSIS — R109 Unspecified abdominal pain: Secondary | ICD-10-CM | POA: Diagnosis not present

## 2023-03-05 LAB — URINALYSIS, W/ REFLEX TO CULTURE (INFECTION SUSPECTED)
Bilirubin Urine: NEGATIVE
Glucose, UA: NEGATIVE mg/dL
Hgb urine dipstick: NEGATIVE
Ketones, ur: NEGATIVE mg/dL
Nitrite: NEGATIVE
Protein, ur: NEGATIVE mg/dL
RBC / HPF: NONE SEEN RBC/hpf (ref 0–5)
Specific Gravity, Urine: 1.015 (ref 1.005–1.030)
pH: 6.5 (ref 5.0–8.0)

## 2023-03-05 LAB — CBC WITH DIFFERENTIAL/PLATELET
Abs Immature Granulocytes: 0.01 10*3/uL (ref 0.00–0.07)
Basophils Absolute: 0.1 10*3/uL (ref 0.0–0.1)
Basophils Relative: 1 %
Eosinophils Absolute: 0.1 10*3/uL (ref 0.0–0.5)
Eosinophils Relative: 1 %
HCT: 39.3 % (ref 36.0–46.0)
Hemoglobin: 12.5 g/dL (ref 12.0–15.0)
Immature Granulocytes: 0 %
Lymphocytes Relative: 34 %
Lymphs Abs: 1.9 10*3/uL (ref 0.7–4.0)
MCH: 29.3 pg (ref 26.0–34.0)
MCHC: 31.8 g/dL (ref 30.0–36.0)
MCV: 92.3 fL (ref 80.0–100.0)
Monocytes Absolute: 0.4 10*3/uL (ref 0.1–1.0)
Monocytes Relative: 7 %
Neutro Abs: 3.2 10*3/uL (ref 1.7–7.7)
Neutrophils Relative %: 57 %
Platelets: 237 10*3/uL (ref 150–400)
RBC: 4.26 MIL/uL (ref 3.87–5.11)
RDW: 14.6 % (ref 11.5–15.5)
WBC: 5.7 10*3/uL (ref 4.0–10.5)
nRBC: 0 % (ref 0.0–0.2)

## 2023-03-05 LAB — COMPREHENSIVE METABOLIC PANEL
ALT: 17 U/L (ref 0–44)
AST: 24 U/L (ref 15–41)
Albumin: 3.9 g/dL (ref 3.5–5.0)
Alkaline Phosphatase: 52 U/L (ref 38–126)
Anion gap: 9 (ref 5–15)
BUN: 16 mg/dL (ref 8–23)
CO2: 26 mmol/L (ref 22–32)
Calcium: 8.2 mg/dL — ABNORMAL LOW (ref 8.9–10.3)
Chloride: 103 mmol/L (ref 98–111)
Creatinine, Ser: 1.01 mg/dL — ABNORMAL HIGH (ref 0.44–1.00)
GFR, Estimated: >60 mL/min (ref >60)
Glucose, Bld: 86 mg/dL (ref 70–99)
Potassium: 3.6 mmol/L (ref 3.5–5.1)
Sodium: 138 mmol/L (ref 135–145)
Total Bilirubin: 1.7 mg/dL — ABNORMAL HIGH (ref 0.3–1.2)
Total Protein: 7.1 g/dL (ref 6.5–8.1)

## 2023-03-05 LAB — LIPASE, BLOOD: Lipase: 32 U/L (ref 11–51)

## 2023-03-05 MED ORDER — LACTATED RINGERS IV BOLUS
1000.0000 mL | Freq: Once | INTRAVENOUS | Status: AC
  Administered 2023-03-05: 1000 mL via INTRAVENOUS

## 2023-03-05 MED ORDER — IOHEXOL 300 MG/ML  SOLN
100.0000 mL | Freq: Once | INTRAMUSCULAR | Status: AC | PRN
  Administered 2023-03-05: 100 mL via INTRAVENOUS

## 2023-03-05 MED ORDER — KETOROLAC TROMETHAMINE 30 MG/ML IJ SOLN
30.0000 mg | Freq: Once | INTRAMUSCULAR | Status: DC
  Filled 2023-03-05: qty 1

## 2023-03-05 NOTE — ED Notes (Signed)
To CT

## 2023-03-05 NOTE — ED Triage Notes (Signed)
Patient presents to ED via POV from home. Here with lower abdominal pain and headache that began Friday.

## 2023-03-05 NOTE — ED Provider Notes (Signed)
[place] [facility] Provider Note   CSN: [id number] Arrival date & time: 03/05/23  7253     History {Add pertinent medical, surgical, social history, OB history to HPI:1} Chief Complaint  Patient presents with   Abdominal Pain    Peggy Wagner is a 67 y.o. female.  HPI      67yo female with history of thyroid carcinoma, post-surgical hypothyroidism, OSA, presents with concern for abdominal pain.   Symptoms began Friday with pain in lower abdomen, more on left side, like a dull ache Had some diarrhea Headache began slowly after No trauma, no fevers Denies numbness, weakness, difficulty talking or walking, visual changes or facial droop.    Past Medical History:  Diagnosis Date   CARCINOMA, THYROID GLAND, PAPILLARY 09/20/2007   12/08-thyroidectomy--1.8 cm right papillary adeno ca 07/09-I 131 109 mci  12/09 -tg neg (ab neg) 1/10-thyrogen scan neg 06/10-tg neg (ab neg) 02/12-tg neg (ab neg)   Dizziness and giddiness 07/17/2008   Hypoparathyroidism (HCC) 09/24/2007   HYPOTHYROIDISM, POSTSURGICAL 09/20/2007   INSOMNIA 09/20/2007   RECTAL BLEEDING, HX OF 05/20/2007   Sleep apnea     Past Surgical History:  Procedure Laterality Date   DILATATION & CURETTAGE/HYSTEROSCOPY WITH MYOSURE N/A 09/15/2020   Procedure: DILATATION AND CURETTAGE /HYSTEROSCOPY WITH MYOSURE;  Surgeon: Gerald Leitz, MD;  Location: [facility];  Service: Gynecology;  Laterality: N/A;    Home Medications Prior to Admission medications   Medication Sig Start Date End Date Taking? Authorizing Provider  calcitRIOL (ROCALTROL) 0.25 MCG capsule TAKE (1) CAPSULE DAILY. 08/10/16   Romero Belling, MD  Calcium Carbonate-Vitamin D (CALCIUM + D PO) Take 1 tablet by mouth daily.    [provider]  cholecalciferol (VITAMIN D3) 25 MCG (1000 UNIT) tablet Take 1,000 Units by mouth daily.    [provider]  ciprofloxacin (CIPRO) 500 MG tablet Take 1  tablet (500 mg total) by mouth 2 (two) times daily. One po bid x 7 days 08/08/21   Geoffery Lyons, MD  ibuprofen (ADVIL) 800 MG tablet Take 1 tablet (800 mg total) by mouth every 8 (eight) hours as needed. 09/15/20   Gerald Leitz, MD  levothyroxine (SYNTHROID, LEVOTHROID) 88 MCG tablet Take 1 tablet (88 mcg total) by mouth daily before breakfast. 04/26/17   Romero Belling, MD  Multiple Vitamins-Minerals (CENTRUM SILVER ULTRA WOMENS) TABS Take 1 tablet by mouth daily.    [provider]  rosuvastatin (CRESTOR) 10 MG tablet Take 10 mg by mouth daily.    [provider]      Allergies    Codeine, Erythromycin, and Penicillins    Review of Systems   Review of Systems  Physical Exam Updated Vital Signs BP (!) 144/79   Pulse 77   Temp 98.4 F (36.9 C) (Oral)   Resp 16   Ht 5\' 4"  (1.626 m)   Wt 93.4 kg   SpO2 99%   BMI 35.36 kg/m  Physical Exam  ED Results / Procedures / Treatments   Labs (all labs ordered are listed, but only abnormal results are displayed) Labs Reviewed - No data to display  EKG None  Radiology No results found.  Procedures Procedures  {Document cardiac monitor, telemetry assessment procedure when appropriate:1}  Medications Ordered in ED Medications - No data to display  ED Course/ Medical Decision Making/ A&P   {   Click here for ABCD2, HEART and other calculatorsREFRESH Note before signing :1}  Medical Decision Making Amount and/or Complexity of Data Reviewed Labs: ordered. Radiology: ordered.  Risk Prescription drug management.   ***  {Document critical care time when appropriate:1} {Document review of labs and clinical decision tools ie heart score, Chads2Vasc2 etc:1}  {Document your independent review of radiology images, and any outside records:1} {Document your discussion with family members, caretakers, and with consultants:1} {Document social determinants of health affecting pt's  care:1} {Document your decision making why or why not admission, treatments were needed:1} Final Clinical Impression(s) / ED Diagnoses Final diagnoses:  None    Rx / DC Orders ED Discharge Orders     None

## 2023-03-05 NOTE — ED Notes (Signed)
Up to BR.

## 2023-03-07 DIAGNOSIS — M6281 Muscle weakness (generalized): Secondary | ICD-10-CM | POA: Diagnosis not present

## 2023-03-07 DIAGNOSIS — M25511 Pain in right shoulder: Secondary | ICD-10-CM | POA: Diagnosis not present

## 2023-03-07 DIAGNOSIS — M25512 Pain in left shoulder: Secondary | ICD-10-CM | POA: Diagnosis not present

## 2023-03-07 DIAGNOSIS — R293 Abnormal posture: Secondary | ICD-10-CM | POA: Diagnosis not present

## 2023-03-12 DIAGNOSIS — M25511 Pain in right shoulder: Secondary | ICD-10-CM | POA: Diagnosis not present

## 2023-03-12 DIAGNOSIS — M9901 Segmental and somatic dysfunction of cervical region: Secondary | ICD-10-CM | POA: Diagnosis not present

## 2023-03-12 DIAGNOSIS — M6281 Muscle weakness (generalized): Secondary | ICD-10-CM | POA: Diagnosis not present

## 2023-03-12 DIAGNOSIS — M5032 Other cervical disc degeneration, mid-cervical region, unspecified level: Secondary | ICD-10-CM | POA: Diagnosis not present

## 2023-03-12 DIAGNOSIS — M25512 Pain in left shoulder: Secondary | ICD-10-CM | POA: Diagnosis not present

## 2023-03-12 DIAGNOSIS — R293 Abnormal posture: Secondary | ICD-10-CM | POA: Diagnosis not present

## 2023-03-12 DIAGNOSIS — M9902 Segmental and somatic dysfunction of thoracic region: Secondary | ICD-10-CM | POA: Diagnosis not present

## 2023-03-12 DIAGNOSIS — M5414 Radiculopathy, thoracic region: Secondary | ICD-10-CM | POA: Diagnosis not present

## 2023-03-13 DIAGNOSIS — R109 Unspecified abdominal pain: Secondary | ICD-10-CM | POA: Diagnosis not present

## 2023-03-13 DIAGNOSIS — R0600 Dyspnea, unspecified: Secondary | ICD-10-CM | POA: Diagnosis not present

## 2023-03-14 DIAGNOSIS — M6281 Muscle weakness (generalized): Secondary | ICD-10-CM | POA: Diagnosis not present

## 2023-03-14 DIAGNOSIS — M25511 Pain in right shoulder: Secondary | ICD-10-CM | POA: Diagnosis not present

## 2023-03-14 DIAGNOSIS — R293 Abnormal posture: Secondary | ICD-10-CM | POA: Diagnosis not present

## 2023-03-14 DIAGNOSIS — M25512 Pain in left shoulder: Secondary | ICD-10-CM | POA: Diagnosis not present

## 2023-03-19 DIAGNOSIS — M6281 Muscle weakness (generalized): Secondary | ICD-10-CM | POA: Diagnosis not present

## 2023-03-19 DIAGNOSIS — M25511 Pain in right shoulder: Secondary | ICD-10-CM | POA: Diagnosis not present

## 2023-03-19 DIAGNOSIS — M25512 Pain in left shoulder: Secondary | ICD-10-CM | POA: Diagnosis not present

## 2023-03-19 DIAGNOSIS — R293 Abnormal posture: Secondary | ICD-10-CM | POA: Diagnosis not present

## 2023-03-21 DIAGNOSIS — M6281 Muscle weakness (generalized): Secondary | ICD-10-CM | POA: Diagnosis not present

## 2023-03-21 DIAGNOSIS — M25512 Pain in left shoulder: Secondary | ICD-10-CM | POA: Diagnosis not present

## 2023-03-21 DIAGNOSIS — M25511 Pain in right shoulder: Secondary | ICD-10-CM | POA: Diagnosis not present

## 2023-03-21 DIAGNOSIS — R293 Abnormal posture: Secondary | ICD-10-CM | POA: Diagnosis not present

## 2023-03-26 DIAGNOSIS — R293 Abnormal posture: Secondary | ICD-10-CM | POA: Diagnosis not present

## 2023-03-26 DIAGNOSIS — M25511 Pain in right shoulder: Secondary | ICD-10-CM | POA: Diagnosis not present

## 2023-03-26 DIAGNOSIS — M6281 Muscle weakness (generalized): Secondary | ICD-10-CM | POA: Diagnosis not present

## 2023-03-26 DIAGNOSIS — M25512 Pain in left shoulder: Secondary | ICD-10-CM | POA: Diagnosis not present

## 2023-03-28 DIAGNOSIS — M25512 Pain in left shoulder: Secondary | ICD-10-CM | POA: Diagnosis not present

## 2023-03-28 DIAGNOSIS — M6281 Muscle weakness (generalized): Secondary | ICD-10-CM | POA: Diagnosis not present

## 2023-03-28 DIAGNOSIS — M25511 Pain in right shoulder: Secondary | ICD-10-CM | POA: Diagnosis not present

## 2023-03-28 DIAGNOSIS — R293 Abnormal posture: Secondary | ICD-10-CM | POA: Diagnosis not present

## 2023-04-02 DIAGNOSIS — M6281 Muscle weakness (generalized): Secondary | ICD-10-CM | POA: Diagnosis not present

## 2023-04-02 DIAGNOSIS — M25511 Pain in right shoulder: Secondary | ICD-10-CM | POA: Diagnosis not present

## 2023-04-02 DIAGNOSIS — M25512 Pain in left shoulder: Secondary | ICD-10-CM | POA: Diagnosis not present

## 2023-04-02 DIAGNOSIS — R293 Abnormal posture: Secondary | ICD-10-CM | POA: Diagnosis not present

## 2023-04-05 DIAGNOSIS — M9902 Segmental and somatic dysfunction of thoracic region: Secondary | ICD-10-CM | POA: Diagnosis not present

## 2023-04-05 DIAGNOSIS — M5414 Radiculopathy, thoracic region: Secondary | ICD-10-CM | POA: Diagnosis not present

## 2023-04-05 DIAGNOSIS — M9901 Segmental and somatic dysfunction of cervical region: Secondary | ICD-10-CM | POA: Diagnosis not present

## 2023-04-05 DIAGNOSIS — M5032 Other cervical disc degeneration, mid-cervical region, unspecified level: Secondary | ICD-10-CM | POA: Diagnosis not present

## 2023-04-12 DIAGNOSIS — G4733 Obstructive sleep apnea (adult) (pediatric): Secondary | ICD-10-CM | POA: Diagnosis not present

## 2023-04-18 DIAGNOSIS — R5383 Other fatigue: Secondary | ICD-10-CM | POA: Diagnosis not present

## 2023-04-18 DIAGNOSIS — R059 Cough, unspecified: Secondary | ICD-10-CM | POA: Diagnosis not present

## 2023-04-18 DIAGNOSIS — Z8616 Personal history of COVID-19: Secondary | ICD-10-CM | POA: Diagnosis not present

## 2023-04-18 DIAGNOSIS — R0981 Nasal congestion: Secondary | ICD-10-CM | POA: Diagnosis not present

## 2023-05-07 DIAGNOSIS — M6281 Muscle weakness (generalized): Secondary | ICD-10-CM | POA: Diagnosis not present

## 2023-05-07 DIAGNOSIS — M9901 Segmental and somatic dysfunction of cervical region: Secondary | ICD-10-CM | POA: Diagnosis not present

## 2023-05-07 DIAGNOSIS — M25511 Pain in right shoulder: Secondary | ICD-10-CM | POA: Diagnosis not present

## 2023-05-07 DIAGNOSIS — M5414 Radiculopathy, thoracic region: Secondary | ICD-10-CM | POA: Diagnosis not present

## 2023-05-07 DIAGNOSIS — R293 Abnormal posture: Secondary | ICD-10-CM | POA: Diagnosis not present

## 2023-05-07 DIAGNOSIS — M25512 Pain in left shoulder: Secondary | ICD-10-CM | POA: Diagnosis not present

## 2023-05-07 DIAGNOSIS — M5032 Other cervical disc degeneration, mid-cervical region, unspecified level: Secondary | ICD-10-CM | POA: Diagnosis not present

## 2023-05-07 DIAGNOSIS — M9902 Segmental and somatic dysfunction of thoracic region: Secondary | ICD-10-CM | POA: Diagnosis not present

## 2023-05-14 DIAGNOSIS — R293 Abnormal posture: Secondary | ICD-10-CM | POA: Diagnosis not present

## 2023-05-14 DIAGNOSIS — M6281 Muscle weakness (generalized): Secondary | ICD-10-CM | POA: Diagnosis not present

## 2023-05-14 DIAGNOSIS — M25512 Pain in left shoulder: Secondary | ICD-10-CM | POA: Diagnosis not present

## 2023-05-14 DIAGNOSIS — M25511 Pain in right shoulder: Secondary | ICD-10-CM | POA: Diagnosis not present

## 2023-05-18 DIAGNOSIS — R293 Abnormal posture: Secondary | ICD-10-CM | POA: Diagnosis not present

## 2023-05-18 DIAGNOSIS — M25511 Pain in right shoulder: Secondary | ICD-10-CM | POA: Diagnosis not present

## 2023-05-18 DIAGNOSIS — M25512 Pain in left shoulder: Secondary | ICD-10-CM | POA: Diagnosis not present

## 2023-05-18 DIAGNOSIS — M6281 Muscle weakness (generalized): Secondary | ICD-10-CM | POA: Diagnosis not present

## 2023-05-21 DIAGNOSIS — M6281 Muscle weakness (generalized): Secondary | ICD-10-CM | POA: Diagnosis not present

## 2023-05-21 DIAGNOSIS — M25512 Pain in left shoulder: Secondary | ICD-10-CM | POA: Diagnosis not present

## 2023-05-21 DIAGNOSIS — R293 Abnormal posture: Secondary | ICD-10-CM | POA: Diagnosis not present

## 2023-05-21 DIAGNOSIS — M25511 Pain in right shoulder: Secondary | ICD-10-CM | POA: Diagnosis not present

## 2023-05-25 DIAGNOSIS — M25512 Pain in left shoulder: Secondary | ICD-10-CM | POA: Diagnosis not present

## 2023-05-25 DIAGNOSIS — M6281 Muscle weakness (generalized): Secondary | ICD-10-CM | POA: Diagnosis not present

## 2023-05-25 DIAGNOSIS — R293 Abnormal posture: Secondary | ICD-10-CM | POA: Diagnosis not present

## 2023-05-25 DIAGNOSIS — M25511 Pain in right shoulder: Secondary | ICD-10-CM | POA: Diagnosis not present

## 2023-05-29 DIAGNOSIS — M6281 Muscle weakness (generalized): Secondary | ICD-10-CM | POA: Diagnosis not present

## 2023-05-29 DIAGNOSIS — M25511 Pain in right shoulder: Secondary | ICD-10-CM | POA: Diagnosis not present

## 2023-05-29 DIAGNOSIS — M25512 Pain in left shoulder: Secondary | ICD-10-CM | POA: Diagnosis not present

## 2023-05-29 DIAGNOSIS — R293 Abnormal posture: Secondary | ICD-10-CM | POA: Diagnosis not present

## 2023-06-04 DIAGNOSIS — M25512 Pain in left shoulder: Secondary | ICD-10-CM | POA: Diagnosis not present

## 2023-06-04 DIAGNOSIS — R293 Abnormal posture: Secondary | ICD-10-CM | POA: Diagnosis not present

## 2023-06-04 DIAGNOSIS — M25511 Pain in right shoulder: Secondary | ICD-10-CM | POA: Diagnosis not present

## 2023-06-04 DIAGNOSIS — M6281 Muscle weakness (generalized): Secondary | ICD-10-CM | POA: Diagnosis not present

## 2023-06-04 DIAGNOSIS — M5032 Other cervical disc degeneration, mid-cervical region, unspecified level: Secondary | ICD-10-CM | POA: Diagnosis not present

## 2023-06-04 DIAGNOSIS — M5414 Radiculopathy, thoracic region: Secondary | ICD-10-CM | POA: Diagnosis not present

## 2023-06-04 DIAGNOSIS — M9902 Segmental and somatic dysfunction of thoracic region: Secondary | ICD-10-CM | POA: Diagnosis not present

## 2023-06-04 DIAGNOSIS — M9901 Segmental and somatic dysfunction of cervical region: Secondary | ICD-10-CM | POA: Diagnosis not present

## 2023-06-22 DIAGNOSIS — E890 Postprocedural hypothyroidism: Secondary | ICD-10-CM | POA: Diagnosis not present

## 2023-07-06 ENCOUNTER — Other Ambulatory Visit: Payer: Self-pay | Admitting: Internal Medicine

## 2023-07-06 DIAGNOSIS — Z1231 Encounter for screening mammogram for malignant neoplasm of breast: Secondary | ICD-10-CM

## 2023-07-11 DIAGNOSIS — G4733 Obstructive sleep apnea (adult) (pediatric): Secondary | ICD-10-CM | POA: Diagnosis not present

## 2023-07-12 DIAGNOSIS — Z0000 Encounter for general adult medical examination without abnormal findings: Secondary | ICD-10-CM | POA: Diagnosis not present

## 2023-07-12 DIAGNOSIS — Z9989 Dependence on other enabling machines and devices: Secondary | ICD-10-CM | POA: Diagnosis not present

## 2023-07-12 DIAGNOSIS — G4733 Obstructive sleep apnea (adult) (pediatric): Secondary | ICD-10-CM | POA: Diagnosis not present

## 2023-07-12 DIAGNOSIS — Z5181 Encounter for therapeutic drug level monitoring: Secondary | ICD-10-CM | POA: Diagnosis not present

## 2023-07-12 DIAGNOSIS — E890 Postprocedural hypothyroidism: Secondary | ICD-10-CM | POA: Diagnosis not present

## 2023-07-12 DIAGNOSIS — E7800 Pure hypercholesterolemia, unspecified: Secondary | ICD-10-CM | POA: Diagnosis not present

## 2023-07-13 DIAGNOSIS — Z5181 Encounter for therapeutic drug level monitoring: Secondary | ICD-10-CM | POA: Diagnosis not present

## 2023-07-13 DIAGNOSIS — E7800 Pure hypercholesterolemia, unspecified: Secondary | ICD-10-CM | POA: Diagnosis not present

## 2023-07-17 ENCOUNTER — Ambulatory Visit
Admission: RE | Admit: 2023-07-17 | Discharge: 2023-07-17 | Disposition: A | Discharge status: Home / Self Care | Payer: Medicare PPO | Source: Ambulatory Visit | Attending: Internal Medicine | Admitting: Internal Medicine

## 2023-07-17 DIAGNOSIS — Z1231 Encounter for screening mammogram for malignant neoplasm of breast: Secondary | ICD-10-CM

## 2023-09-03 DIAGNOSIS — M5032 Other cervical disc degeneration, mid-cervical region, unspecified level: Secondary | ICD-10-CM | POA: Diagnosis not present

## 2023-09-03 DIAGNOSIS — M9901 Segmental and somatic dysfunction of cervical region: Secondary | ICD-10-CM | POA: Diagnosis not present

## 2023-09-03 DIAGNOSIS — M5414 Radiculopathy, thoracic region: Secondary | ICD-10-CM | POA: Diagnosis not present

## 2023-09-03 DIAGNOSIS — M9902 Segmental and somatic dysfunction of thoracic region: Secondary | ICD-10-CM | POA: Diagnosis not present

## 2023-09-16 DIAGNOSIS — J988 Other specified respiratory disorders: Secondary | ICD-10-CM | POA: Diagnosis not present

## 2023-10-08 DIAGNOSIS — M5414 Radiculopathy, thoracic region: Secondary | ICD-10-CM | POA: Diagnosis not present

## 2023-10-08 DIAGNOSIS — M9901 Segmental and somatic dysfunction of cervical region: Secondary | ICD-10-CM | POA: Diagnosis not present

## 2023-10-08 DIAGNOSIS — M9902 Segmental and somatic dysfunction of thoracic region: Secondary | ICD-10-CM | POA: Diagnosis not present

## 2023-10-08 DIAGNOSIS — M5032 Other cervical disc degeneration, mid-cervical region, unspecified level: Secondary | ICD-10-CM | POA: Diagnosis not present

## 2023-10-09 DIAGNOSIS — G4733 Obstructive sleep apnea (adult) (pediatric): Secondary | ICD-10-CM | POA: Diagnosis not present

## 2023-11-14 DIAGNOSIS — H9192 Unspecified hearing loss, left ear: Secondary | ICD-10-CM | POA: Diagnosis not present

## 2023-11-14 DIAGNOSIS — T7840XA Allergy, unspecified, initial encounter: Secondary | ICD-10-CM | POA: Diagnosis not present

## 2023-12-17 DIAGNOSIS — M9901 Segmental and somatic dysfunction of cervical region: Secondary | ICD-10-CM | POA: Diagnosis not present

## 2023-12-17 DIAGNOSIS — M5414 Radiculopathy, thoracic region: Secondary | ICD-10-CM | POA: Diagnosis not present

## 2023-12-17 DIAGNOSIS — M9902 Segmental and somatic dysfunction of thoracic region: Secondary | ICD-10-CM | POA: Diagnosis not present

## 2023-12-17 DIAGNOSIS — M5032 Other cervical disc degeneration, mid-cervical region, unspecified level: Secondary | ICD-10-CM | POA: Diagnosis not present

## 2023-12-25 DIAGNOSIS — M25562 Pain in left knee: Secondary | ICD-10-CM | POA: Diagnosis not present

## 2023-12-25 DIAGNOSIS — M170 Bilateral primary osteoarthritis of knee: Secondary | ICD-10-CM | POA: Diagnosis not present

## 2023-12-26 DIAGNOSIS — M179 Osteoarthritis of knee, unspecified: Secondary | ICD-10-CM | POA: Insufficient documentation

## 2024-01-07 DIAGNOSIS — G4733 Obstructive sleep apnea (adult) (pediatric): Secondary | ICD-10-CM | POA: Diagnosis not present

## 2024-01-14 DIAGNOSIS — G4733 Obstructive sleep apnea (adult) (pediatric): Secondary | ICD-10-CM | POA: Diagnosis not present

## 2024-01-28 DIAGNOSIS — M5414 Radiculopathy, thoracic region: Secondary | ICD-10-CM | POA: Diagnosis not present

## 2024-01-28 DIAGNOSIS — M5032 Other cervical disc degeneration, mid-cervical region, unspecified level: Secondary | ICD-10-CM | POA: Diagnosis not present

## 2024-01-28 DIAGNOSIS — M9902 Segmental and somatic dysfunction of thoracic region: Secondary | ICD-10-CM | POA: Diagnosis not present

## 2024-01-28 DIAGNOSIS — M9901 Segmental and somatic dysfunction of cervical region: Secondary | ICD-10-CM | POA: Diagnosis not present

## 2024-02-04 DIAGNOSIS — Z85850 Personal history of malignant neoplasm of thyroid: Secondary | ICD-10-CM | POA: Diagnosis not present

## 2024-02-04 DIAGNOSIS — E892 Postprocedural hypoparathyroidism: Secondary | ICD-10-CM | POA: Diagnosis not present

## 2024-02-04 DIAGNOSIS — E890 Postprocedural hypothyroidism: Secondary | ICD-10-CM | POA: Diagnosis not present

## 2024-02-12 DIAGNOSIS — E669 Obesity, unspecified: Secondary | ICD-10-CM | POA: Diagnosis not present

## 2024-02-12 DIAGNOSIS — G4709 Other insomnia: Secondary | ICD-10-CM | POA: Diagnosis not present

## 2024-02-12 DIAGNOSIS — G4700 Insomnia, unspecified: Secondary | ICD-10-CM | POA: Diagnosis not present

## 2024-02-12 DIAGNOSIS — R5383 Other fatigue: Secondary | ICD-10-CM | POA: Diagnosis not present

## 2024-02-12 DIAGNOSIS — E890 Postprocedural hypothyroidism: Secondary | ICD-10-CM | POA: Diagnosis not present

## 2024-02-12 DIAGNOSIS — E892 Postprocedural hypoparathyroidism: Secondary | ICD-10-CM | POA: Diagnosis not present

## 2024-02-12 DIAGNOSIS — Z85850 Personal history of malignant neoplasm of thyroid: Secondary | ICD-10-CM | POA: Diagnosis not present

## 2024-02-18 DIAGNOSIS — E892 Postprocedural hypoparathyroidism: Secondary | ICD-10-CM | POA: Diagnosis not present

## 2024-02-28 DIAGNOSIS — M1712 Unilateral primary osteoarthritis, left knee: Secondary | ICD-10-CM | POA: Diagnosis not present

## 2024-02-28 DIAGNOSIS — M170 Bilateral primary osteoarthritis of knee: Secondary | ICD-10-CM | POA: Diagnosis not present

## 2024-03-10 DIAGNOSIS — M5032 Other cervical disc degeneration, mid-cervical region, unspecified level: Secondary | ICD-10-CM | POA: Diagnosis not present

## 2024-03-10 DIAGNOSIS — M9902 Segmental and somatic dysfunction of thoracic region: Secondary | ICD-10-CM | POA: Diagnosis not present

## 2024-03-10 DIAGNOSIS — M5414 Radiculopathy, thoracic region: Secondary | ICD-10-CM | POA: Diagnosis not present

## 2024-03-10 DIAGNOSIS — M9901 Segmental and somatic dysfunction of cervical region: Secondary | ICD-10-CM | POA: Diagnosis not present

## 2024-04-21 DIAGNOSIS — H9192 Unspecified hearing loss, left ear: Secondary | ICD-10-CM | POA: Diagnosis not present

## 2024-05-27 DIAGNOSIS — G4733 Obstructive sleep apnea (adult) (pediatric): Secondary | ICD-10-CM | POA: Diagnosis not present

## 2024-06-02 DIAGNOSIS — H25812 Combined forms of age-related cataract, left eye: Secondary | ICD-10-CM | POA: Diagnosis not present

## 2024-06-02 DIAGNOSIS — H2511 Age-related nuclear cataract, right eye: Secondary | ICD-10-CM | POA: Diagnosis not present

## 2024-06-20 DIAGNOSIS — E890 Postprocedural hypothyroidism: Secondary | ICD-10-CM | POA: Diagnosis not present

## 2024-06-20 DIAGNOSIS — E892 Postprocedural hypoparathyroidism: Secondary | ICD-10-CM | POA: Diagnosis not present

## 2024-06-20 DIAGNOSIS — R52 Pain, unspecified: Secondary | ICD-10-CM | POA: Diagnosis not present

## 2024-06-20 DIAGNOSIS — Z85850 Personal history of malignant neoplasm of thyroid: Secondary | ICD-10-CM | POA: Diagnosis not present

## 2024-06-20 DIAGNOSIS — R221 Localized swelling, mass and lump, neck: Secondary | ICD-10-CM | POA: Diagnosis not present

## 2024-06-23 DIAGNOSIS — M542 Cervicalgia: Secondary | ICD-10-CM | POA: Diagnosis not present

## 2024-06-23 DIAGNOSIS — M5030 Other cervical disc degeneration, unspecified cervical region: Secondary | ICD-10-CM | POA: Diagnosis not present

## 2024-07-01 DIAGNOSIS — E892 Postprocedural hypoparathyroidism: Secondary | ICD-10-CM | POA: Diagnosis not present

## 2024-07-04 DIAGNOSIS — M1712 Unilateral primary osteoarthritis, left knee: Secondary | ICD-10-CM | POA: Diagnosis not present

## 2024-07-07 DIAGNOSIS — Z6832 Body mass index (BMI) 32.0-32.9, adult: Secondary | ICD-10-CM | POA: Diagnosis not present

## 2024-07-07 DIAGNOSIS — E890 Postprocedural hypothyroidism: Secondary | ICD-10-CM | POA: Diagnosis not present

## 2024-07-07 DIAGNOSIS — M170 Bilateral primary osteoarthritis of knee: Secondary | ICD-10-CM | POA: Diagnosis not present

## 2024-07-07 DIAGNOSIS — E66811 Obesity, class 1: Secondary | ICD-10-CM | POA: Diagnosis not present

## 2024-07-07 DIAGNOSIS — Z131 Encounter for screening for diabetes mellitus: Secondary | ICD-10-CM | POA: Diagnosis not present

## 2024-07-07 DIAGNOSIS — Z1331 Encounter for screening for depression: Secondary | ICD-10-CM | POA: Diagnosis not present

## 2024-07-07 DIAGNOSIS — G4733 Obstructive sleep apnea (adult) (pediatric): Secondary | ICD-10-CM | POA: Diagnosis not present

## 2024-07-07 DIAGNOSIS — E7800 Pure hypercholesterolemia, unspecified: Secondary | ICD-10-CM | POA: Diagnosis not present

## 2024-07-07 DIAGNOSIS — R635 Abnormal weight gain: Secondary | ICD-10-CM | POA: Diagnosis not present

## 2024-07-08 DIAGNOSIS — E892 Postprocedural hypoparathyroidism: Secondary | ICD-10-CM | POA: Diagnosis not present

## 2024-07-08 DIAGNOSIS — Z85850 Personal history of malignant neoplasm of thyroid: Secondary | ICD-10-CM | POA: Diagnosis not present

## 2024-07-08 DIAGNOSIS — E669 Obesity, unspecified: Secondary | ICD-10-CM | POA: Diagnosis not present

## 2024-07-08 DIAGNOSIS — G4733 Obstructive sleep apnea (adult) (pediatric): Secondary | ICD-10-CM | POA: Diagnosis not present

## 2024-07-11 DIAGNOSIS — M1712 Unilateral primary osteoarthritis, left knee: Secondary | ICD-10-CM | POA: Diagnosis not present

## 2024-07-18 DIAGNOSIS — M4802 Spinal stenosis, cervical region: Secondary | ICD-10-CM | POA: Diagnosis not present

## 2024-07-18 DIAGNOSIS — M9901 Segmental and somatic dysfunction of cervical region: Secondary | ICD-10-CM | POA: Diagnosis not present

## 2024-07-18 DIAGNOSIS — S29012A Strain of muscle and tendon of back wall of thorax, initial encounter: Secondary | ICD-10-CM | POA: Diagnosis not present

## 2024-07-18 DIAGNOSIS — M9900 Segmental and somatic dysfunction of head region: Secondary | ICD-10-CM | POA: Diagnosis not present

## 2024-07-21 DIAGNOSIS — E663 Overweight: Secondary | ICD-10-CM | POA: Diagnosis not present

## 2024-07-21 DIAGNOSIS — Z85850 Personal history of malignant neoplasm of thyroid: Secondary | ICD-10-CM | POA: Diagnosis not present

## 2024-07-21 DIAGNOSIS — E039 Hypothyroidism, unspecified: Secondary | ICD-10-CM | POA: Diagnosis not present

## 2024-07-21 DIAGNOSIS — Z1331 Encounter for screening for depression: Secondary | ICD-10-CM | POA: Diagnosis not present

## 2024-07-21 DIAGNOSIS — Z0000 Encounter for general adult medical examination without abnormal findings: Secondary | ICD-10-CM | POA: Diagnosis not present

## 2024-07-21 DIAGNOSIS — E7800 Pure hypercholesterolemia, unspecified: Secondary | ICD-10-CM | POA: Diagnosis not present

## 2024-07-22 DIAGNOSIS — M4802 Spinal stenosis, cervical region: Secondary | ICD-10-CM | POA: Diagnosis not present

## 2024-07-22 DIAGNOSIS — S29012A Strain of muscle and tendon of back wall of thorax, initial encounter: Secondary | ICD-10-CM | POA: Diagnosis not present

## 2024-07-22 DIAGNOSIS — M9901 Segmental and somatic dysfunction of cervical region: Secondary | ICD-10-CM | POA: Diagnosis not present

## 2024-07-22 DIAGNOSIS — M9900 Segmental and somatic dysfunction of head region: Secondary | ICD-10-CM | POA: Diagnosis not present

## 2024-07-25 DIAGNOSIS — S29012A Strain of muscle and tendon of back wall of thorax, initial encounter: Secondary | ICD-10-CM | POA: Diagnosis not present

## 2024-07-25 DIAGNOSIS — M9901 Segmental and somatic dysfunction of cervical region: Secondary | ICD-10-CM | POA: Diagnosis not present

## 2024-07-25 DIAGNOSIS — M9900 Segmental and somatic dysfunction of head region: Secondary | ICD-10-CM | POA: Diagnosis not present

## 2024-07-25 DIAGNOSIS — M4802 Spinal stenosis, cervical region: Secondary | ICD-10-CM | POA: Diagnosis not present

## 2024-07-28 ENCOUNTER — Other Ambulatory Visit: Payer: Self-pay | Admitting: Internal Medicine

## 2024-07-28 DIAGNOSIS — E890 Postprocedural hypothyroidism: Secondary | ICD-10-CM | POA: Diagnosis not present

## 2024-07-28 DIAGNOSIS — E66811 Obesity, class 1: Secondary | ICD-10-CM | POA: Diagnosis not present

## 2024-07-28 DIAGNOSIS — Z6832 Body mass index (BMI) 32.0-32.9, adult: Secondary | ICD-10-CM | POA: Diagnosis not present

## 2024-07-28 DIAGNOSIS — E7800 Pure hypercholesterolemia, unspecified: Secondary | ICD-10-CM | POA: Diagnosis not present

## 2024-07-28 DIAGNOSIS — G4733 Obstructive sleep apnea (adult) (pediatric): Secondary | ICD-10-CM | POA: Diagnosis not present

## 2024-07-28 DIAGNOSIS — R635 Abnormal weight gain: Secondary | ICD-10-CM | POA: Diagnosis not present

## 2024-07-28 DIAGNOSIS — Z1231 Encounter for screening mammogram for malignant neoplasm of breast: Secondary | ICD-10-CM

## 2024-07-28 DIAGNOSIS — R948 Abnormal results of function studies of other organs and systems: Secondary | ICD-10-CM | POA: Diagnosis not present

## 2024-07-28 DIAGNOSIS — M170 Bilateral primary osteoarthritis of knee: Secondary | ICD-10-CM | POA: Diagnosis not present

## 2024-07-29 DIAGNOSIS — S29012A Strain of muscle and tendon of back wall of thorax, initial encounter: Secondary | ICD-10-CM | POA: Diagnosis not present

## 2024-07-29 DIAGNOSIS — M9900 Segmental and somatic dysfunction of head region: Secondary | ICD-10-CM | POA: Diagnosis not present

## 2024-07-29 DIAGNOSIS — M9901 Segmental and somatic dysfunction of cervical region: Secondary | ICD-10-CM | POA: Diagnosis not present

## 2024-07-29 DIAGNOSIS — M4802 Spinal stenosis, cervical region: Secondary | ICD-10-CM | POA: Diagnosis not present

## 2024-07-30 DIAGNOSIS — J069 Acute upper respiratory infection, unspecified: Secondary | ICD-10-CM | POA: Diagnosis not present

## 2024-08-05 DIAGNOSIS — M9900 Segmental and somatic dysfunction of head region: Secondary | ICD-10-CM | POA: Diagnosis not present

## 2024-08-05 DIAGNOSIS — S29012A Strain of muscle and tendon of back wall of thorax, initial encounter: Secondary | ICD-10-CM | POA: Diagnosis not present

## 2024-08-05 DIAGNOSIS — M4802 Spinal stenosis, cervical region: Secondary | ICD-10-CM | POA: Diagnosis not present

## 2024-08-05 DIAGNOSIS — M9901 Segmental and somatic dysfunction of cervical region: Secondary | ICD-10-CM | POA: Diagnosis not present

## 2024-08-11 DIAGNOSIS — G4733 Obstructive sleep apnea (adult) (pediatric): Secondary | ICD-10-CM | POA: Diagnosis not present

## 2024-08-11 DIAGNOSIS — R948 Abnormal results of function studies of other organs and systems: Secondary | ICD-10-CM | POA: Diagnosis not present

## 2024-08-11 DIAGNOSIS — R635 Abnormal weight gain: Secondary | ICD-10-CM | POA: Diagnosis not present

## 2024-08-11 DIAGNOSIS — M170 Bilateral primary osteoarthritis of knee: Secondary | ICD-10-CM | POA: Diagnosis not present

## 2024-08-11 DIAGNOSIS — S29012A Strain of muscle and tendon of back wall of thorax, initial encounter: Secondary | ICD-10-CM | POA: Diagnosis not present

## 2024-08-11 DIAGNOSIS — M9901 Segmental and somatic dysfunction of cervical region: Secondary | ICD-10-CM | POA: Diagnosis not present

## 2024-08-11 DIAGNOSIS — E7800 Pure hypercholesterolemia, unspecified: Secondary | ICD-10-CM | POA: Diagnosis not present

## 2024-08-11 DIAGNOSIS — M9900 Segmental and somatic dysfunction of head region: Secondary | ICD-10-CM | POA: Diagnosis not present

## 2024-08-11 DIAGNOSIS — Z6832 Body mass index (BMI) 32.0-32.9, adult: Secondary | ICD-10-CM | POA: Diagnosis not present

## 2024-08-11 DIAGNOSIS — M4802 Spinal stenosis, cervical region: Secondary | ICD-10-CM | POA: Diagnosis not present

## 2024-08-11 DIAGNOSIS — E66811 Obesity, class 1: Secondary | ICD-10-CM | POA: Diagnosis not present

## 2024-08-13 ENCOUNTER — Encounter: Payer: Self-pay | Admitting: Allergy

## 2024-08-13 ENCOUNTER — Other Ambulatory Visit: Payer: Self-pay

## 2024-08-13 ENCOUNTER — Ambulatory Visit: Admitting: Allergy

## 2024-08-13 VITALS — BP 108/74 | HR 77 | Resp 18 | Ht 63.25 in | Wt 193.4 lb

## 2024-08-13 DIAGNOSIS — L2481 Irritant contact dermatitis due to metals: Secondary | ICD-10-CM | POA: Diagnosis not present

## 2024-08-13 DIAGNOSIS — T50905D Adverse effect of unspecified drugs, medicaments and biological substances, subsequent encounter: Secondary | ICD-10-CM

## 2024-08-13 DIAGNOSIS — J3089 Other allergic rhinitis: Secondary | ICD-10-CM | POA: Diagnosis not present

## 2024-08-13 DIAGNOSIS — Z880 Allergy status to penicillin: Secondary | ICD-10-CM

## 2024-08-13 DIAGNOSIS — J302 Other seasonal allergic rhinitis: Secondary | ICD-10-CM

## 2024-08-13 NOTE — Progress Notes (Signed)
 New Patient Note  RE: [name] MRN: [id number] DOB: [birth year] Date of Office Visit: 08/13/2024  Primary care provider: Rexanne Ingle, MD  Chief Complaint: patch testing  History of present illness: Peggy Wagner is a 68 y.o. female presenting today for evaluation of metal allergy.  She will be having a knee replacement surgery next year and Dr Melodi is her careers adviser.  Discussed the use of AI scribe software for clinical note transcription with the patient, who gave verbal consent to proceed.  She is scheduled for joint replacement surgery in March and was referred for evaluation of possible metal allergy. She does report having an issue with silver  jewelry which causes her to break out.    She received a cortisone shot last month and hoping she does not need to get any more.   She has a history of environmental allergies, including cats, dust, grass, and ragweed, confirmed by testing in the early 1980s and again in the mid-1990s. She has not been on allergy shots and does not currently take medication for these allergies.  She recalls a childhood allergy to penicillin, which caused hives, but has not had exposure since then. She also reports a reaction to erythromycin, which made her feel very sick.  Review of systems: 10pt ROS negative unless noted above in HPI  Past medical history: Past Medical History:  Diagnosis Date   CARCINOMA, THYROID  GLAND, PAPILLARY 09/20/2007   12/08-thyroidectomy--1.8 cm right papillary adeno ca 07/09-I 131 109 mci  12/09 -tg neg (ab neg) 1/10-thyrogen scan neg 06/10-tg neg (ab neg) 02/12-tg neg (ab neg)   Dizziness and giddiness 07/17/2008   Hypoparathyroidism 09/24/2007   HYPOTHYROIDISM, POSTSURGICAL 09/20/2007   INSOMNIA 09/20/2007   RECTAL BLEEDING, HX OF 05/20/2007   Sleep apnea     Past surgical history: Past Surgical History:  Procedure Laterality Date   DILATATION & CURETTAGE/HYSTEROSCOPY WITH MYOSURE N/A 09/15/2020    Procedure: DILATATION AND CURETTAGE /HYSTEROSCOPY WITH MYOSURE;  Surgeon: Rosalva Sawyer, MD;  Location: [facility];  Service: Gynecology;  Laterality: N/A;    Family history:  Family History  Problem Relation Age of Onset   Allergic rhinitis Sister    Asthma Sister    Breast cancer Sister 68   Thyroid  disease Sister        uncertain type   Osteoporosis Other    Cancer Other        FH of Prostate Cancer-1st degree relative    Social history: Lives in a townhome with carpeting in bedroom with gas heating and central cooling.  No pets in the home.  Previous owner had cats.  Cats are in the neighborhood.  No concern for water damage, mildew or roaches in the home.  She is a retired runner, broadcasting/film/video. Denies smoking history.    Medication List: Current Outpatient Medications  Medication Sig Dispense Refill   calcitRIOL  (ROCALTROL ) 0.25 MCG capsule TAKE (1) CAPSULE DAILY. 30 capsule 3   Calcium Carbonate-Vitamin D  (CALCIUM + D PO) Take 1 tablet by mouth daily.     cholecalciferol (VITAMIN D3) 25 MCG (1000 UNIT) tablet Take 1,000 Units by mouth daily.     levothyroxine  (SYNTHROID ) 100 MCG tablet Take 100 mcg by mouth daily before breakfast.     levothyroxine  (SYNTHROID ) 88 MCG tablet Take 88 mcg by mouth daily before breakfast.     Multiple Vitamins-Minerals (CENTRUM SILVER  ULTRA WOMENS) TABS Take 1 tablet by mouth daily.     No current facility-administered medications for this  visit.    Known medication allergies: Allergies  Allergen Reactions   Codeine    Erythromycin    Penicillins      Physical examination: Blood pressure 108/74, pulse 77, resp. rate 18, height 5' 3.25 (1.607 m), weight 193 lb 6.4 oz (87.7 kg), SpO2 99%.  General: Alert, interactive, in no acute distress. HEENT: PERRLA, TMs pearly gray, turbinates non-edematous without discharge, post-pharynx non erythematous. Neck: Supple without lymphadenopathy. Lungs: Clear to auscultation without wheezing, rhonchi  or rales. {no increased work of breathing. CV: Normal S1, S2 without murmurs. Abdomen: Nondistended, nontender. Skin: Warm and dry, without lesions or rashes. Extremities:  No clubbing, cyanosis or edema. Neuro:   Grossly intact.  Diagnostics/Labs: None today  Assessment and plan:   Evaluation for possible metal contact allergy prior to joint replacement Reports issues with silver  jewelry.   - Schedule patch testing for metal contact allergy.  Discussed this is a week long process with patch placement on Monday and return to office for readings on Wednesday, Friday and following Monday.  Once patches are placed can not get them wet.  Can take antihistamines if itching.  Need to avoid steroids (ie. Prednisone, steroid shots) for at least 4 weeks prior to patch placement.    - Coordinate testing schedule with her availability, ensure no steroid use four weeks prior. - Will send patch testing results to Dr. Hiram once testing is completed.    History of penicillin allergy--consideration for penicillin allergy delabeling Reported childhood penicillin allergy with hives. Low likelihood of current allergy. Graded oral challenge recommended to confirm status. - Recommend graded oral penicillin challenge to assess current allergy status. - Instruct to avoid antihistamines for three days prior to challenge. - Will need to send prescription for amoxicillin for challenge for you to bring to office.  - Observe for reaction during challenge and manage symptoms if occurs. - Schedule for penicillin graded challenge when ready to do so.    Seasonal allergies - Continue avoidance measures of environmental allergens - If needed can take antihistamine like Zyrtec, Allegra or Xyzal as needed - If symptoms progress/worsen consider updating allergy testing.   Schedule patch testing for metal series  I appreciate the opportunity to take part in Joydan's care. Please do not hesitate to contact me with  questions.  Sincerely,   Danita Brain, MD Allergy/Immunology [facility]

## 2024-08-13 NOTE — Patient Instructions (Signed)
 Evaluation for possible metal contact allergy prior to joint replacement Reports issues with silver  jewelry.   - Schedule patch testing for metal contact allergy.  Discussed this is a week long process with patch placement on Monday and return to office for readings on Wednesday, Friday and following Monday.  Once patches are placed can not get them wet.  Can take antihistamines if itching.  Need to avoid steroids (ie. Prednisone, steroid shots) for at least 4 weeks prior to patch placement.    - Coordinate testing schedule with her availability, ensure no steroid use four weeks prior. - Will send patch testing results to Dr. Hiram once testing is completed.    History of penicillin allergy--consideration for penicillin allergy delabeling Reported childhood penicillin allergy with hives. Low likelihood of current allergy. Graded oral challenge recommended to confirm status. - Recommend graded oral penicillin challenge to assess current allergy status. - Instruct to avoid antihistamines for three days prior to challenge. - Will need to send prescription for amoxicillin for challenge for you to bring to office.  - Observe for reaction during challenge and manage symptoms if occurs. - Schedule for penicillin graded challenge when ready to do so.    Seasonal allergies - Continue avoidance measures of environmental allergens - If needed can take antihistamine like Zyrtec, Allegra or Xyzal as needed - If symptoms progress/worsen consider updating allergy testing.   Schedule patch testing for metal series

## 2024-08-18 DIAGNOSIS — S29012A Strain of muscle and tendon of back wall of thorax, initial encounter: Secondary | ICD-10-CM | POA: Diagnosis not present

## 2024-08-18 DIAGNOSIS — M4802 Spinal stenosis, cervical region: Secondary | ICD-10-CM | POA: Diagnosis not present

## 2024-08-18 DIAGNOSIS — M9900 Segmental and somatic dysfunction of head region: Secondary | ICD-10-CM | POA: Diagnosis not present

## 2024-08-18 DIAGNOSIS — M9901 Segmental and somatic dysfunction of cervical region: Secondary | ICD-10-CM | POA: Diagnosis not present

## 2024-08-19 ENCOUNTER — Ambulatory Visit
Admission: RE | Admit: 2024-08-19 | Discharge: 2024-08-19 | Disposition: A | Discharge status: Home / Self Care | Source: Ambulatory Visit | Attending: Internal Medicine | Admitting: Internal Medicine

## 2024-08-19 DIAGNOSIS — Z1231 Encounter for screening mammogram for malignant neoplasm of breast: Secondary | ICD-10-CM | POA: Diagnosis not present

## 2024-09-03 DIAGNOSIS — M9901 Segmental and somatic dysfunction of cervical region: Secondary | ICD-10-CM | POA: Diagnosis not present

## 2024-09-03 DIAGNOSIS — S29012A Strain of muscle and tendon of back wall of thorax, initial encounter: Secondary | ICD-10-CM | POA: Diagnosis not present

## 2024-09-03 DIAGNOSIS — M4802 Spinal stenosis, cervical region: Secondary | ICD-10-CM | POA: Diagnosis not present

## 2024-09-03 DIAGNOSIS — M9900 Segmental and somatic dysfunction of head region: Secondary | ICD-10-CM | POA: Diagnosis not present

## 2024-09-26 NOTE — Patient Instructions (Signed)
" ° °  [address] [place] [address] Dept: [phone]     Diagnostics: Metal patch series placed (30)    1. Cobalt (II) chloride hexahydrate  2. Nickel (II) sulfate hexahydrate  3. Potassium dichromate  4. Methyl methacrylate  5. Bacitracin  6. Titanium (III) nitride   7. Titanium (IV) oxalate hydrate  8. Molybdenum  9. Manganese chloride  10. Ferric chloride  11. Aluminum (III) chloride hexahydrate  12. Aluminum hydroxide  13. Vanadium (III) chloride  14. Copper (II) sulfate pentahydrate  15. Gold (I) sodium thiosulfate dihydrate  16. Palladium (II) chloride  17. Tin  18. Zinc chloride  19. Zirconium (IV) chloride  20. Beryllium (II) sulfate tetrahydrate  21. Cadmium chloride  22. Indium (III) chloride  23. Niobium (V) chloride  24. Mercury (II) amidochloride  25. Ruthenium  26. Silver  nitrate  27. Tantalum  28. Gallium (III) oxide  29. Ammonium hexachloroplatinate (IV)  30. Tungsten that you have already papers     Allergic contact dermatitis - Instructions provided on care of the patches for the next 48 hours. GLENWOOD Kins was instructed to avoid showering for the next 48 hours. Kiearra Oyervides will follow up in 48 hours,  96 hours and 7 days for patch readings.   Call the clinic if this treatment plan is not working well for you  Follow up in 2 days or sooner if needed. "

## 2024-09-26 NOTE — Progress Notes (Signed)
" ° °  [address] [place] [address] Dept: [phone]  Follow-up Note  RE: Peggy Wagner MRN: [id number] DOB: 09-25-1956 Date of Office Visit: 09/29/2024  Primary care provider: Rexanne Ingle, MD Referring provider: Rexanne Ingle, MD   Peggy Wagner returns to the office today for the patch test placement, given suspected history of contact dermatitis.  Patient reports feeling well overall with no steroid use over the last 4 weeks. Care of patches discussed in detail, specifically the need to keep patches dry and in place. All questions answered at this time.    Diagnostics: Metal patch series placed (30)    1. Cobalt (II) chloride hexahydrate  2. Nickel (II) sulfate hexahydrate  3. Potassium dichromate  4. Methyl methacrylate  5. Bacitracin  6. Titanium (III) nitride   7. Titanium (IV) oxalate hydrate  8. Molybdenum  9. Manganese chloride  10. Ferric chloride  11. Aluminum (III) chloride hexahydrate  12. Aluminum hydroxide  13. Vanadium (III) chloride  14. Copper (II) sulfate pentahydrate  15. Gold (I) sodium thiosulfate dihydrate  16. Palladium (II) chloride  17. Tin  18. Zinc chloride  19. Zirconium (IV) chloride  20. Beryllium (II) sulfate tetrahydrate  21. Cadmium chloride  22. Indium (III) chloride  23. Niobium (V) chloride  24. Mercury (II) amidochloride  25. Ruthenium  26. Silver  nitrate  27. Tantalum  28. Gallium (III) oxide  29. Ammonium hexachloroplatinate (IV)  30. Tungsten     Allergic contact dermatitis - Instructions provided on care of the patches for the next 48 hours. Peggy Wagner was instructed to avoid showering for the next 48 hours. Peggy Wagner will follow up in 48 hours,  96 hours and 7 days for patch readings.   Call the clinic if this treatment plan is not working well for you  Follow up in 2 days or sooner if needed.  Thank you for the opportunity to care for this patient.  Please do not hesitate to contact me with questions.  Arlean Mutter, FNP [facility]  [facility]   "

## 2024-09-29 ENCOUNTER — Encounter: Payer: Self-pay | Admitting: Family Medicine

## 2024-09-29 ENCOUNTER — Encounter: Admitting: Family Medicine

## 2024-09-29 DIAGNOSIS — L230 Allergic contact dermatitis due to metals: Secondary | ICD-10-CM | POA: Insufficient documentation

## 2024-10-01 ENCOUNTER — Ambulatory Visit: Admitting: Allergy

## 2024-10-01 ENCOUNTER — Encounter: Payer: Self-pay | Admitting: Allergy

## 2024-10-01 DIAGNOSIS — K219 Gastro-esophageal reflux disease without esophagitis: Secondary | ICD-10-CM | POA: Insufficient documentation

## 2024-10-01 DIAGNOSIS — Z85850 Personal history of malignant neoplasm of thyroid: Secondary | ICD-10-CM | POA: Insufficient documentation

## 2024-10-01 DIAGNOSIS — M1990 Unspecified osteoarthritis, unspecified site: Secondary | ICD-10-CM | POA: Insufficient documentation

## 2024-10-01 DIAGNOSIS — E559 Vitamin D deficiency, unspecified: Secondary | ICD-10-CM | POA: Insufficient documentation

## 2024-10-01 DIAGNOSIS — N952 Postmenopausal atrophic vaginitis: Secondary | ICD-10-CM | POA: Insufficient documentation

## 2024-10-01 DIAGNOSIS — E7800 Pure hypercholesterolemia, unspecified: Secondary | ICD-10-CM | POA: Insufficient documentation

## 2024-10-01 DIAGNOSIS — E2839 Other primary ovarian failure: Secondary | ICD-10-CM | POA: Insufficient documentation

## 2024-10-01 DIAGNOSIS — L230 Allergic contact dermatitis due to metals: Secondary | ICD-10-CM

## 2024-10-01 DIAGNOSIS — G4733 Obstructive sleep apnea (adult) (pediatric): Secondary | ICD-10-CM | POA: Insufficient documentation

## 2024-10-01 DIAGNOSIS — F32A Depression, unspecified: Secondary | ICD-10-CM | POA: Insufficient documentation

## 2024-10-01 DIAGNOSIS — E039 Hypothyroidism, unspecified: Secondary | ICD-10-CM | POA: Insufficient documentation

## 2024-10-01 NOTE — Progress Notes (Signed)
" ° ° °  Follow-up Note  RE: Peggy Wagner MRN: [id number] DOB: 04-19-1956 Date of Office Visit: 10/01/2024  Primary care provider: Rexanne Ingle, MD Referring provider: Rexanne Ingle, MD   Peggy Wagner returns to the office today for the initial patch test interpretation, given suspected history of metal allergy with upcoming joint replacement.    Diagnostics:  Metal series patch test 48 hour reading: 1+  Vanadium   Plan:  Allergic contact dermatitis to metals The patient has been provided detailed information regarding the substances she is sensitive to, as well as products containing the substances.  Meticulous avoidance of these substances is recommended. If avoidance is not possible, the use of barrier creams or lotions is recommended. Return in 2 days for 2nd reading of 3 readings.   Danita Brain, MD [facility]    "

## 2024-10-03 ENCOUNTER — Ambulatory Visit: Admitting: Allergy

## 2024-10-03 ENCOUNTER — Encounter: Payer: Self-pay | Admitting: Allergy

## 2024-10-03 DIAGNOSIS — L230 Allergic contact dermatitis due to metals: Secondary | ICD-10-CM | POA: Diagnosis not present

## 2024-10-03 NOTE — Progress Notes (Signed)
" ° ° °  Follow-up Note  RE: Peggy Wagner MRN: [id number] DOB: 04-08-1956 Date of Office Visit: 10/03/2024  Primary care provider: Rexanne Ingle, MD Referring provider: Rexanne Ingle, MD   [name] returns to the office today for the second patch test interpretation, given history of contact dermatitis.    Diagnostics:  Metal series patch test 96 hour reading: 1+  Nickel sulfate  -------------------------------------------- Metal series patch test 48 hour reading: 1+  Vanadium     Plan:  Allergic contact dermatitis The patient has been provided detailed information regarding the substances she is sensitive to, as well as products containing the substances.  Meticulous avoidance of these substances is recommended. If avoidance is not possible, the use of barrier creams or lotions is recommended. Return 10/06/24 for final reading.  Will send final results to Dr Melodi.    Danita Brain, MD [facility]  "

## 2024-10-05 NOTE — Patient Instructions (Incomplete)
" ° ° °  Follow-up Note  RE: Peggy Wagner MRN: [id number] DOB: [birth year] Date of Office Visit: 10/03/2024  Primary care provider: Rexanne Ingle, MD Referring provider: Rexanne Ingle, MD   [name] returns to the office today for the second patch test interpretation, given history of contact dermatitis.    Diagnostics:  Metal series patch test 96 hour reading: 1+  Nickel sulfate  -------------------------------------------- Metal series patch test 48 hour reading: 1+  Vanadium     Plan:  Allergic contact dermatitis The patient has been provided detailed information regarding the substances she is sensitive to, as well as products containing the substances.  Meticulous avoidance of these substances is recommended. If avoidance is not possible, the use of barrier creams or lotions is recommended. Return 10/06/24 for final reading.  Will send final results to Dr Melodi.    Danita Brain, MD [facility] "

## 2024-10-05 NOTE — Progress Notes (Unsigned)
" ° ° °  Follow-up Note  RE: [name] MRN: [id number] DOB: 1956/08/17 Date of Office Visit: 10/06/2024  Primary care provider: Rexanne Ingle, MD Referring provider: Rexanne Ingle, MD   Peggy Wagner returns to the office today for the final patch test interpretation, given history of contact dermatitis.    Diagnostics:  Metal series patch test 96 hour reading: 1+  Nickel sulfate  -------------------------------------------- Metal series patch test 48 hour reading: 1+  Vanadium  --------------------------------------------  Metal series patch testing 7 day reading:  Plan:  Allergic contact dermatitis The patient has been provided detailed information regarding the substances she is sensitive to, as well as products containing the substances.  Meticulous avoidance of these substances is recommended. If avoidance is not possible, the use of barrier creams or lotions is recommended.  Will send final results to Dr Melodi.    Danita Brain, MD [facility]  "

## 2024-10-06 ENCOUNTER — Ambulatory Visit: Admitting: Family Medicine

## 2024-10-06 ENCOUNTER — Encounter: Payer: Self-pay | Admitting: Family Medicine

## 2024-10-06 DIAGNOSIS — L230 Allergic contact dermatitis due to metals: Secondary | ICD-10-CM

## 2024-12-08 ENCOUNTER — Ambulatory Visit (HOSPITAL_COMMUNITY): Admit: 2024-12-08 | Admitting: Orthopedic Surgery

## 2024-12-08 SURGERY — ARTHROPLASTY, KNEE, TOTAL
Anesthesia: Choice | Site: Knee | Laterality: Left
